# Patient Record
Sex: Female | Born: 1950 | Race: White | Hispanic: No | Marital: Married | Smoking: Never smoker
Health system: Southern US, Community
[De-identification: ages and names within clinical notes are randomized; demographics above are authoritative.]

## PROBLEM LIST (undated history)

## (undated) DIAGNOSIS — L719 Rosacea, unspecified: Secondary | ICD-10-CM

## (undated) DIAGNOSIS — M1990 Unspecified osteoarthritis, unspecified site: Secondary | ICD-10-CM

## (undated) DIAGNOSIS — Z85528 Personal history of other malignant neoplasm of kidney: Secondary | ICD-10-CM

## (undated) DIAGNOSIS — N6019 Diffuse cystic mastopathy of unspecified breast: Secondary | ICD-10-CM

## (undated) DIAGNOSIS — F4310 Post-traumatic stress disorder, unspecified: Secondary | ICD-10-CM

## (undated) DIAGNOSIS — G43909 Migraine, unspecified, not intractable, without status migrainosus: Secondary | ICD-10-CM

## (undated) DIAGNOSIS — K219 Gastro-esophageal reflux disease without esophagitis: Secondary | ICD-10-CM

## (undated) DIAGNOSIS — E041 Nontoxic single thyroid nodule: Secondary | ICD-10-CM

## (undated) DIAGNOSIS — E049 Nontoxic goiter, unspecified: Secondary | ICD-10-CM

## (undated) HISTORY — PX: TUBAL LIGATION: SHX77

## (undated) HISTORY — DX: Nontoxic single thyroid nodule: E04.1

## (undated) HISTORY — DX: Personal history of other malignant neoplasm of kidney: Z85.528

## (undated) HISTORY — PX: OTHER SURGICAL HISTORY: SHX169

## (undated) HISTORY — DX: Migraine, unspecified, not intractable, without status migrainosus: G43.909

## (undated) HISTORY — DX: Diffuse cystic mastopathy of unspecified breast: N60.19

## (undated) HISTORY — DX: Rosacea, unspecified: L71.9

---

## 1999-09-28 DIAGNOSIS — E041 Nontoxic single thyroid nodule: Secondary | ICD-10-CM

## 1999-09-28 HISTORY — DX: Nontoxic single thyroid nodule: E04.1

## 2000-03-04 ENCOUNTER — Ambulatory Visit (HOSPITAL_COMMUNITY): Admission: RE | Admit: 2000-03-04 | Discharge: 2000-03-04 | Payer: Self-pay | Admitting: Urology

## 2000-03-04 ENCOUNTER — Encounter: Payer: Self-pay | Admitting: Urology

## 2000-03-11 ENCOUNTER — Encounter: Payer: Self-pay | Admitting: Family Medicine

## 2000-03-11 ENCOUNTER — Ambulatory Visit (HOSPITAL_COMMUNITY): Admission: RE | Admit: 2000-03-11 | Discharge: 2000-03-11 | Payer: Self-pay | Admitting: Family Medicine

## 2005-02-12 ENCOUNTER — Other Ambulatory Visit: Admission: RE | Admit: 2005-02-12 | Discharge: 2005-02-12 | Payer: Self-pay | Admitting: Family Medicine

## 2006-03-11 ENCOUNTER — Other Ambulatory Visit: Admission: RE | Admit: 2006-03-11 | Discharge: 2006-03-11 | Payer: Self-pay | Admitting: Family Medicine

## 2007-05-12 ENCOUNTER — Other Ambulatory Visit: Admission: RE | Admit: 2007-05-12 | Discharge: 2007-05-12 | Payer: Self-pay | Admitting: Family Medicine

## 2008-06-07 ENCOUNTER — Other Ambulatory Visit: Admission: RE | Admit: 2008-06-07 | Discharge: 2008-06-07 | Payer: Self-pay | Admitting: Family Medicine

## 2009-06-13 ENCOUNTER — Other Ambulatory Visit: Admission: RE | Admit: 2009-06-13 | Discharge: 2009-06-13 | Payer: Self-pay | Admitting: Family Medicine

## 2010-06-19 ENCOUNTER — Other Ambulatory Visit: Admission: RE | Admit: 2010-06-19 | Discharge: 2010-06-19 | Payer: Self-pay | Admitting: Family Medicine

## 2011-03-30 ENCOUNTER — Other Ambulatory Visit: Payer: Self-pay | Admitting: Dermatology

## 2011-07-08 ENCOUNTER — Other Ambulatory Visit: Payer: Self-pay | Admitting: Family Medicine

## 2011-07-08 DIAGNOSIS — E041 Nontoxic single thyroid nodule: Secondary | ICD-10-CM

## 2011-07-16 ENCOUNTER — Ambulatory Visit
Admission: RE | Admit: 2011-07-16 | Discharge: 2011-07-16 | Disposition: A | Discharge status: Home / Self Care | Payer: 59 | Source: Ambulatory Visit | Attending: Family Medicine | Admitting: Family Medicine

## 2011-07-16 DIAGNOSIS — E041 Nontoxic single thyroid nodule: Secondary | ICD-10-CM

## 2012-09-29 ENCOUNTER — Other Ambulatory Visit: Payer: Self-pay

## 2013-07-27 ENCOUNTER — Other Ambulatory Visit (HOSPITAL_COMMUNITY)
Admission: RE | Admit: 2013-07-27 | Discharge: 2013-07-27 | Disposition: A | Discharge status: Home / Self Care | Payer: BC Managed Care – PPO | Source: Ambulatory Visit | Attending: Family Medicine | Admitting: Family Medicine

## 2013-07-27 ENCOUNTER — Other Ambulatory Visit: Payer: Self-pay | Admitting: Family Medicine

## 2013-07-27 DIAGNOSIS — Z124 Encounter for screening for malignant neoplasm of cervix: Secondary | ICD-10-CM | POA: Insufficient documentation

## 2013-10-05 ENCOUNTER — Other Ambulatory Visit: Payer: Self-pay

## 2014-04-02 ENCOUNTER — Other Ambulatory Visit: Payer: Self-pay

## 2014-04-18 ENCOUNTER — Other Ambulatory Visit: Payer: Self-pay

## 2014-08-02 ENCOUNTER — Other Ambulatory Visit: Payer: Self-pay | Admitting: Family Medicine

## 2014-08-02 DIAGNOSIS — E041 Nontoxic single thyroid nodule: Secondary | ICD-10-CM

## 2014-08-29 ENCOUNTER — Ambulatory Visit
Admission: RE | Admit: 2014-08-29 | Discharge: 2014-08-29 | Disposition: A | Discharge status: Home / Self Care | Payer: BC Managed Care – PPO | Source: Ambulatory Visit | Attending: Family Medicine | Admitting: Family Medicine

## 2014-08-29 DIAGNOSIS — E041 Nontoxic single thyroid nodule: Secondary | ICD-10-CM

## 2014-09-27 DIAGNOSIS — Z85528 Personal history of other malignant neoplasm of kidney: Secondary | ICD-10-CM

## 2014-09-27 HISTORY — DX: Personal history of other malignant neoplasm of kidney: Z85.528

## 2015-02-07 ENCOUNTER — Other Ambulatory Visit (HOSPITAL_COMMUNITY): Payer: Self-pay | Admitting: Family Medicine

## 2015-02-07 DIAGNOSIS — R1032 Left lower quadrant pain: Secondary | ICD-10-CM

## 2015-02-10 ENCOUNTER — Encounter (HOSPITAL_COMMUNITY): Payer: Self-pay

## 2015-02-10 ENCOUNTER — Ambulatory Visit (HOSPITAL_COMMUNITY)
Admission: RE | Admit: 2015-02-10 | Discharge: 2015-02-10 | Disposition: A | Discharge status: Home / Self Care | Payer: BLUE CROSS/BLUE SHIELD | Source: Ambulatory Visit | Attending: Family Medicine | Admitting: Family Medicine

## 2015-02-10 DIAGNOSIS — R509 Fever, unspecified: Secondary | ICD-10-CM | POA: Insufficient documentation

## 2015-02-10 DIAGNOSIS — R197 Diarrhea, unspecified: Secondary | ICD-10-CM | POA: Insufficient documentation

## 2015-02-10 DIAGNOSIS — K869 Disease of pancreas, unspecified: Secondary | ICD-10-CM | POA: Insufficient documentation

## 2015-02-10 DIAGNOSIS — R1032 Left lower quadrant pain: Secondary | ICD-10-CM | POA: Diagnosis present

## 2015-02-10 DIAGNOSIS — K5730 Diverticulosis of large intestine without perforation or abscess without bleeding: Secondary | ICD-10-CM | POA: Diagnosis not present

## 2015-02-10 DIAGNOSIS — N2889 Other specified disorders of kidney and ureter: Secondary | ICD-10-CM | POA: Diagnosis not present

## 2015-02-10 LAB — POCT I-STAT CREATININE: CREATININE: 0.8 mg/dL (ref 0.44–1.00)

## 2015-02-10 MED ORDER — IOHEXOL 300 MG/ML  SOLN
80.0000 mL | Freq: Once | INTRAMUSCULAR | Status: AC | PRN
Start: 2015-02-10 — End: 2015-02-10
  Administered 2015-02-10: 80 mL via INTRAVENOUS

## 2015-02-12 ENCOUNTER — Other Ambulatory Visit (HOSPITAL_COMMUNITY): Payer: Self-pay | Admitting: Urology

## 2015-02-12 ENCOUNTER — Ambulatory Visit (HOSPITAL_COMMUNITY)
Admission: RE | Admit: 2015-02-12 | Discharge: 2015-02-12 | Disposition: A | Discharge status: Home / Self Care | Payer: BLUE CROSS/BLUE SHIELD | Source: Ambulatory Visit | Attending: Urology | Admitting: Urology

## 2015-02-12 DIAGNOSIS — N2889 Other specified disorders of kidney and ureter: Secondary | ICD-10-CM | POA: Insufficient documentation

## 2015-03-05 ENCOUNTER — Other Ambulatory Visit: Payer: Self-pay | Admitting: Urology

## 2015-03-06 NOTE — Progress Notes (Signed)
Please put orders in Epic surgery 03-10-15 pre op 03-07-15

## 2015-03-07 ENCOUNTER — Encounter (HOSPITAL_COMMUNITY): Payer: Self-pay

## 2015-03-07 ENCOUNTER — Encounter (HOSPITAL_COMMUNITY)
Admission: RE | Admit: 2015-03-07 | Discharge: 2015-03-07 | Disposition: A | Discharge status: Home / Self Care | Payer: BLUE CROSS/BLUE SHIELD | Source: Ambulatory Visit | Attending: Urology | Admitting: Urology

## 2015-03-07 DIAGNOSIS — N289 Disorder of kidney and ureter, unspecified: Secondary | ICD-10-CM | POA: Insufficient documentation

## 2015-03-07 DIAGNOSIS — Z01818 Encounter for other preprocedural examination: Secondary | ICD-10-CM | POA: Insufficient documentation

## 2015-03-07 HISTORY — DX: Post-traumatic stress disorder, unspecified: F43.10

## 2015-03-07 HISTORY — DX: Nontoxic goiter, unspecified: E04.9

## 2015-03-07 HISTORY — DX: Gastro-esophageal reflux disease without esophagitis: K21.9

## 2015-03-07 HISTORY — DX: Unspecified osteoarthritis, unspecified site: M19.90

## 2015-03-07 LAB — CBC
HCT: 43.3 % (ref 36.0–46.0)
Hemoglobin: 14 g/dL (ref 12.0–15.0)
MCH: 28.9 pg (ref 26.0–34.0)
MCHC: 32.3 g/dL (ref 30.0–36.0)
MCV: 89.3 fL (ref 78.0–100.0)
PLATELETS: 273 10*3/uL (ref 150–400)
RBC: 4.85 MIL/uL (ref 3.87–5.11)
RDW: 13.1 % (ref 11.5–15.5)
WBC: 4.3 10*3/uL (ref 4.0–10.5)

## 2015-03-07 LAB — BASIC METABOLIC PANEL
ANION GAP: 6 (ref 5–15)
BUN: 13 mg/dL (ref 6–20)
CO2: 29 mmol/L (ref 22–32)
Calcium: 9.4 mg/dL (ref 8.9–10.3)
Chloride: 106 mmol/L (ref 101–111)
Creatinine, Ser: 0.79 mg/dL (ref 0.44–1.00)
GFR calc Af Amer: >60 mL/min (ref >60)
GFR calc non Af Amer: >60 mL/min (ref >60)
GLUCOSE: 96 mg/dL (ref 65–99)
Potassium: 4.7 mmol/L (ref 3.5–5.1)
SODIUM: 141 mmol/L (ref 135–145)

## 2015-03-07 LAB — ABO/RH: ABO/RH(D): A POS

## 2015-03-07 NOTE — Patient Instructions (Addendum)
Claire Humphrey  03/07/2015   Your procedure is scheduled on: 03/10/2015    Report to [facility] Main  Entrance and follow signs to               [place] at      0930 AM.  Call this number if you have problems the morning of surgery [phone]   Remember: ONLY 1 PERSON MAY GO WITH YOU TO SHORT STAY TO GET  READY MORNING OF [place].  Do not eat food or drink liquids :After Midnight.     Take these medicines the morning of surgery with A SIP OF WATER: none                                You may not have any metal on your body including hair pins and              piercings  Do not wear jewelry, make-up, lotions, powders or perfumes, deodorant             Do not wear nail polish.  Do not shave  48 hours prior to surgery.           Do not bring valuables to the hospital. [place].  Contacts, dentures or bridgework may not be worn into surgery.  Leave suitcase in the car. After surgery it may be brought to your room.       Special Instructions: coughing and deep breathing exercises, leg exercises               Please read over the following fact sheets you were given: _____________________________________________________________________             [facility] - Preparing for Surgery Before surgery, you can play an important role.  Because skin is not sterile, your skin needs to be as free of germs as possible.  You can reduce the number of germs on your skin by washing with CHG (chlorahexidine gluconate) soap before surgery.  CHG is an antiseptic cleaner which kills germs and bonds with the skin to continue killing germs even after washing. Please DO NOT use if you have an allergy to CHG or antibacterial soaps.  If your skin becomes reddened/irritated stop using the CHG and inform your nurse when you arrive at Short Stay. Do not shave (including legs and underarms) for at least 48 hours prior to  the first CHG shower.  You may shave your face/neck. Please follow these instructions carefully:  1.  Shower with CHG Soap the night before surgery and the  morning of Surgery.  2.  If you choose to wash your hair, wash your hair first as usual with your  normal  shampoo.  3.  After you shampoo, rinse your hair and body thoroughly to remove the  shampoo.                           4.  Use CHG as you would any other liquid soap.  You can apply chg directly  to the skin and wash  Gently with a scrungie or clean washcloth.  5.  Apply the CHG Soap to your body ONLY FROM THE NECK DOWN.   Do not use on face/ open                           Wound or open sores. Avoid contact with eyes, ears mouth and genitals (private parts).                       Wash face,  Genitals (private parts) with your normal soap.             6.  Wash thoroughly, paying special attention to the area where your surgery  will be performed.  7.  Thoroughly rinse your body with warm water from the neck down.  8.  DO NOT shower/wash with your normal soap after using and rinsing off  the CHG Soap.                9.  Pat yourself dry with a clean towel.            10.  Wear clean pajamas.            11.  Place clean sheets on your bed the night of your first shower and do not  sleep with pets. Day of Surgery : Do not apply any lotions/deodorants the morning of surgery.  Please wear clean clothes to the hospital/surgery center.  FAILURE TO FOLLOW THESE INSTRUCTIONS MAY RESULT IN THE CANCELLATION OF YOUR SURGERY PATIENT SIGNATURE_________________________________  NURSE SIGNATURE__________________________________  ________________________________________________________________________  WHAT IS A BLOOD TRANSFUSION? Blood Transfusion Information  A transfusion is the replacement of blood or some of its parts. Blood is made up of multiple cells which provide different functions.  Red blood cells carry oxygen and  are used for blood loss replacement.  White blood cells fight against infection.  Platelets control bleeding.  Plasma helps clot blood.  Other blood products are available for specialized needs, such as hemophilia or other clotting disorders. BEFORE THE TRANSFUSION  Who gives blood for transfusions?   Healthy volunteers who are fully evaluated to make sure their blood is safe. This is blood bank blood. Transfusion therapy is the safest it has ever been in the practice of medicine. Before blood is taken from a donor, a complete history is taken to make sure that person has no history of diseases nor engages in risky social behavior (examples are intravenous drug use or sexual activity with multiple partners). The donor's travel history is screened to minimize risk of transmitting infections, such as malaria. The donated blood is tested for signs of infectious diseases, such as HIV and hepatitis. The blood is then tested to be sure it is compatible with you in order to minimize the chance of a transfusion reaction. If you or a relative donates blood, this is often done in anticipation of surgery and is not appropriate for emergency situations. It takes many days to process the donated blood. RISKS AND COMPLICATIONS Although transfusion therapy is very safe and saves many lives, the main dangers of transfusion include:   Getting an infectious disease.  Developing a transfusion reaction. This is an allergic reaction to something in the blood you were given. Every precaution is taken to prevent this. The decision to have a blood transfusion has been considered carefully by your caregiver before blood is given. Blood is not given unless the benefits outweigh  the risks. AFTER THE TRANSFUSION  Right after receiving a blood transfusion, you will usually feel much better and more energetic. This is especially true if your red blood cells have gotten low (anemic). The transfusion raises the level of the  red blood cells which carry oxygen, and this usually causes an energy increase.  The nurse administering the transfusion will monitor you carefully for complications. HOME CARE INSTRUCTIONS  No special instructions are needed after a transfusion. You may find your energy is better. Speak with your caregiver about any limitations on activity for underlying diseases you may have. SEEK MEDICAL CARE IF:   Your condition is not improving after your transfusion.  You develop redness or irritation at the intravenous (IV) site. SEEK IMMEDIATE MEDICAL CARE IF:  Any of the following symptoms occur over the next 12 hours:  Shaking chills.  You have a temperature by mouth above 102 F (38.9 C), not controlled by medicine.  Chest, back, or muscle pain.  People around you feel you are not acting correctly or are confused.  Shortness of breath or difficulty breathing.  Dizziness and fainting.  You get a rash or develop hives.  You have a decrease in urine output.  Your urine turns a dark color or changes to pink, red, or brown. Any of the following symptoms occur over the next 10 days:  You have a temperature by mouth above 102 F (38.9 C), not controlled by medicine.  Shortness of breath.  Weakness after normal activity.  The white part of the eye turns yellow (jaundice).  You have a decrease in the amount of urine or are urinating less often.  Your urine turns a dark color or changes to pink, red, or brown. Document Released: 09/10/2000 Document Revised: 12/06/2011 Document Reviewed: 04/29/2008 [facility] Patient Information 2014 [place], [place].  _______________________________________________________________________

## 2015-03-07 NOTE — Progress Notes (Signed)
Called and left message with Noemi Chapel regarding no orders in EPIC.

## 2015-03-07 NOTE — Progress Notes (Signed)
Patient aware  To hold aspirin 5 days prior to surgery per fax received from [place]   Patient states she does not take daily anyway and last aspirin was on 03/05/2015.

## 2015-03-07 NOTE — Progress Notes (Signed)
Surgery on 03/10/2015.  Preop on 6/10 at 1030am .  No orders in EPIC.  Need orders in EPIC.  Thank You.

## 2015-03-07 NOTE — Progress Notes (Signed)
Patient states lung CT was done at office of Dr Nicolette Bang to followup CXR done 02/12/15 - EPIC.  Called and left message for Noemi Chapel and asked for lung CT scan to be faxed to [phone].

## 2015-03-07 NOTE — Progress Notes (Addendum)
Called and asked [facility] to [facility] LOV note on patient since letter of clearance states - progress note attached but is not.  Also informed them that fax cover sheet does not have patient's name on the cover sheet.  They will give me a call back.  nicole called back and stated all info was faxed to [facility] including progress note.  I asked her about the fax cover sheet " Patient advised to hold aspirin 5 days prior" and Elmyra Ricks ( nurse of Dr Kelton Pillar) stated that was the fax cover sheet for this patient and patient was instructed to hold aspirin 5 days prior.

## 2015-03-07 NOTE — Progress Notes (Signed)
CXR- 5/18/6- EPIC  Clearance- 02/20/15- Dr Kelton Pillar on chart  CBC and CMP done 02/12/2015 on chart.

## 2015-03-10 ENCOUNTER — Inpatient Hospital Stay (HOSPITAL_COMMUNITY): Payer: BLUE CROSS/BLUE SHIELD | Admitting: Certified Registered Nurse Anesthetist

## 2015-03-10 ENCOUNTER — Inpatient Hospital Stay (HOSPITAL_COMMUNITY)
Admission: RE | Admit: 2015-03-10 | Discharge: 2015-03-13 | DRG: 658 | Disposition: A | Discharge status: Home / Self Care | Payer: BLUE CROSS/BLUE SHIELD | Source: Ambulatory Visit | Attending: Urology | Admitting: Urology

## 2015-03-10 ENCOUNTER — Encounter (HOSPITAL_COMMUNITY)
Admission: RE | Disposition: A | Discharge status: Home / Self Care | Payer: Self-pay | Source: Ambulatory Visit | Attending: Urology

## 2015-03-10 ENCOUNTER — Encounter (HOSPITAL_COMMUNITY): Payer: Self-pay | Admitting: *Deleted

## 2015-03-10 DIAGNOSIS — Z01812 Encounter for preprocedural laboratory examination: Secondary | ICD-10-CM | POA: Diagnosis not present

## 2015-03-10 DIAGNOSIS — C642 Malignant neoplasm of left kidney, except renal pelvis: Secondary | ICD-10-CM | POA: Diagnosis present

## 2015-03-10 DIAGNOSIS — N2889 Other specified disorders of kidney and ureter: Secondary | ICD-10-CM | POA: Diagnosis present

## 2015-03-10 DIAGNOSIS — K219 Gastro-esophageal reflux disease without esophagitis: Secondary | ICD-10-CM | POA: Diagnosis present

## 2015-03-10 HISTORY — PX: LAPAROSCOPIC NEPHRECTOMY: SHX1930

## 2015-03-10 LAB — CBC
HCT: 41.8 % (ref 36.0–46.0)
HCT: 41.3 % (ref 36.0–46.0)
HEMOGLOBIN: 13.4 g/dL (ref 12.0–15.0)
Hemoglobin: 13.3 g/dL (ref 12.0–15.0)
MCH: 28.9 pg (ref 26.0–34.0)
MCH: 29 pg (ref 26.0–34.0)
MCHC: 32.1 g/dL (ref 30.0–36.0)
MCHC: 32.2 g/dL (ref 30.0–36.0)
MCV: 90.1 fL (ref 78.0–100.0)
MCV: 90.2 fL (ref 78.0–100.0)
Platelets: 241 10*3/uL (ref 150–400)
Platelets: 221 10*3/uL (ref 150–400)
RBC: 4.64 MIL/uL (ref 3.87–5.11)
RBC: 4.58 MIL/uL (ref 3.87–5.11)
RDW: 13.3 % (ref 11.5–15.5)
RDW: 13.3 % (ref 11.5–15.5)
WBC: 10.5 10*3/uL (ref 4.0–10.5)
WBC: 10.8 10*3/uL — AB (ref 4.0–10.5)

## 2015-03-10 LAB — TYPE AND SCREEN
ABO/RH(D): A POS
Antibody Screen: NEGATIVE

## 2015-03-10 LAB — CREATININE, SERUM
Creatinine, Ser: 1 mg/dL (ref 0.44–1.00)
GFR, EST NON AFRICAN AMERICAN: 58 mL/min — AB (ref >60)

## 2015-03-10 SURGERY — NEPHRECTOMY, RADICAL, LAPAROSCOPIC, ADULT
Anesthesia: General | Site: Abdomen | Laterality: Left

## 2015-03-10 MED ORDER — LACTATED RINGERS IV SOLN
INTRAVENOUS | Status: DC
  Administered 2015-03-10: 1000 mL via INTRAVENOUS
  Administered 2015-03-10: 13:00:00 via INTRAVENOUS

## 2015-03-10 MED ORDER — MIDAZOLAM HCL 2 MG/2ML IJ SOLN
INTRAMUSCULAR | Status: AC
  Filled 2015-03-10: qty 2

## 2015-03-10 MED ORDER — OXYCODONE-ACETAMINOPHEN 5-325 MG PO TABS
1.0000 | ORAL_TABLET | ORAL | Status: DC | PRN
  Administered 2015-03-11 (×2): 2 via ORAL
  Administered 2015-03-11 (×2): 1 via ORAL
  Administered 2015-03-12 – 2015-03-13 (×8): 2 via ORAL
  Filled 2015-03-10: qty 2
  Filled 2015-03-10: qty 1
  Filled 2015-03-10 (×4): qty 2
  Filled 2015-03-10: qty 1
  Filled 2015-03-10 (×5): qty 2

## 2015-03-10 MED ORDER — ONDANSETRON HCL 4 MG/2ML IJ SOLN
INTRAMUSCULAR | Status: DC | PRN
  Administered 2015-03-10: 4 mg via INTRAVENOUS

## 2015-03-10 MED ORDER — HEPARIN SODIUM (PORCINE) 5000 UNIT/ML IJ SOLN
5000.0000 [IU] | Freq: Once | INTRAMUSCULAR | Status: AC
  Administered 2015-03-10: 5000 [IU] via SUBCUTANEOUS
  Filled 2015-03-10: qty 1

## 2015-03-10 MED ORDER — SUCCINYLCHOLINE CHLORIDE 20 MG/ML IJ SOLN
INTRAMUSCULAR | Status: DC | PRN
  Administered 2015-03-10: 100 mg via INTRAVENOUS

## 2015-03-10 MED ORDER — CHLORHEXIDINE GLUCONATE 0.12 % MT SOLN
15.0000 mL | Freq: Two times a day (BID) | OROMUCOSAL | Status: DC
  Administered 2015-03-10 – 2015-03-13 (×5): 15 mL via OROMUCOSAL
  Filled 2015-03-10 (×6): qty 15

## 2015-03-10 MED ORDER — ROCURONIUM BROMIDE 100 MG/10ML IV SOLN
INTRAVENOUS | Status: AC
  Filled 2015-03-10: qty 1

## 2015-03-10 MED ORDER — BELLADONNA ALKALOIDS-OPIUM 16.2-60 MG RE SUPP: 1.0000 | Freq: Four times a day (QID) | RECTAL | Status: DC | PRN

## 2015-03-10 MED ORDER — GLYCOPYRROLATE 0.2 MG/ML IJ SOLN
INTRAMUSCULAR | Status: AC
  Filled 2015-03-10: qty 1

## 2015-03-10 MED ORDER — POLYETHYLENE GLYCOL 3350 17 G PO PACK: 17.0000 g | PACK | Freq: Every day | ORAL | Status: DC | PRN

## 2015-03-10 MED ORDER — SODIUM CHLORIDE 0.9 % IJ SOLN
INTRAMUSCULAR | Status: AC
  Filled 2015-03-10: qty 10

## 2015-03-10 MED ORDER — EPHEDRINE SULFATE 50 MG/ML IJ SOLN
INTRAMUSCULAR | Status: DC | PRN
  Administered 2015-03-10 (×2): 10 mg via INTRAVENOUS

## 2015-03-10 MED ORDER — CEFAZOLIN SODIUM 1-5 GM-% IV SOLN
1.0000 g | Freq: Three times a day (TID) | INTRAVENOUS | Status: AC
  Administered 2015-03-10 – 2015-03-11 (×3): 1 g via INTRAVENOUS
  Filled 2015-03-10 (×3): qty 50

## 2015-03-10 MED ORDER — FENTANYL CITRATE (PF) 250 MCG/5ML IJ SOLN
INTRAMUSCULAR | Status: AC
  Filled 2015-03-10: qty 5

## 2015-03-10 MED ORDER — DEXAMETHASONE SODIUM PHOSPHATE 10 MG/ML IJ SOLN
INTRAMUSCULAR | Status: AC
  Filled 2015-03-10: qty 1

## 2015-03-10 MED ORDER — ACETAMINOPHEN 325 MG PO TABS
650.0000 mg | ORAL_TABLET | ORAL | Status: DC | PRN
  Administered 2015-03-11: 650 mg via ORAL
  Filled 2015-03-10 (×2): qty 2

## 2015-03-10 MED ORDER — PROMETHAZINE HCL 25 MG/ML IJ SOLN: 6.2500 mg | INTRAMUSCULAR | Status: DC | PRN

## 2015-03-10 MED ORDER — GLYCOPYRROLATE 0.2 MG/ML IJ SOLN
INTRAMUSCULAR | Status: AC
  Filled 2015-03-10: qty 3

## 2015-03-10 MED ORDER — CEFAZOLIN SODIUM-DEXTROSE 2-3 GM-% IV SOLR
2.0000 g | INTRAVENOUS | Status: AC
  Administered 2015-03-10: 2 g via INTRAVENOUS

## 2015-03-10 MED ORDER — DEXTROSE-NACL 5-0.45 % IV SOLN
INTRAVENOUS | Status: DC
  Administered 2015-03-10 – 2015-03-11 (×4): via INTRAVENOUS

## 2015-03-10 MED ORDER — ROCURONIUM BROMIDE 100 MG/10ML IV SOLN
INTRAVENOUS | Status: DC | PRN
  Administered 2015-03-10: 10 mg via INTRAVENOUS
  Administered 2015-03-10: 40 mg via INTRAVENOUS
  Administered 2015-03-10: 10 mg via INTRAVENOUS

## 2015-03-10 MED ORDER — DIPHENHYDRAMINE HCL 12.5 MG/5ML PO ELIX: 12.5000 mg | ORAL_SOLUTION | Freq: Four times a day (QID) | ORAL | Status: DC | PRN

## 2015-03-10 MED ORDER — PROPOFOL 10 MG/ML IV BOLUS
INTRAVENOUS | Status: AC
  Filled 2015-03-10: qty 20

## 2015-03-10 MED ORDER — HYDROMORPHONE HCL 2 MG/ML IJ SOLN
INTRAMUSCULAR | Status: AC
  Filled 2015-03-10: qty 1

## 2015-03-10 MED ORDER — HYDROMORPHONE HCL 1 MG/ML IJ SOLN
INTRAMUSCULAR | Status: AC
  Filled 2015-03-10: qty 1

## 2015-03-10 MED ORDER — FENTANYL CITRATE (PF) 100 MCG/2ML IJ SOLN
INTRAMUSCULAR | Status: DC | PRN
  Administered 2015-03-10 (×5): 50 ug via INTRAVENOUS

## 2015-03-10 MED ORDER — DOCUSATE SODIUM 100 MG PO CAPS
100.0000 mg | ORAL_CAPSULE | Freq: Two times a day (BID) | ORAL | Status: DC
  Administered 2015-03-10 – 2015-03-13 (×6): 100 mg via ORAL
  Filled 2015-03-10 (×6): qty 1

## 2015-03-10 MED ORDER — ONDANSETRON HCL 4 MG/2ML IJ SOLN
4.0000 mg | INTRAMUSCULAR | Status: DC | PRN
Start: 2015-03-10 — End: 2015-03-13
  Administered 2015-03-11 – 2015-03-12 (×5): 4 mg via INTRAVENOUS
  Filled 2015-03-10 (×6): qty 2

## 2015-03-10 MED ORDER — MAGNESIUM CITRATE PO SOLN: 1.0000 | Freq: Once | ORAL | Status: AC | PRN

## 2015-03-10 MED ORDER — EPHEDRINE SULFATE 50 MG/ML IJ SOLN
INTRAMUSCULAR | Status: AC
  Filled 2015-03-10: qty 1

## 2015-03-10 MED ORDER — ONDANSETRON HCL 4 MG/2ML IJ SOLN
INTRAMUSCULAR | Status: AC
  Filled 2015-03-10: qty 2

## 2015-03-10 MED ORDER — HYDROMORPHONE HCL 1 MG/ML IJ SOLN
0.2500 mg | INTRAMUSCULAR | Status: DC | PRN
  Administered 2015-03-10: 0.25 mg via INTRAVENOUS
  Administered 2015-03-10 (×2): 0.5 mg via INTRAVENOUS
  Administered 2015-03-10 (×2): 0.25 mg via INTRAVENOUS

## 2015-03-10 MED ORDER — BISACODYL 10 MG RE SUPP
10.0000 mg | Freq: Every day | RECTAL | Status: DC | PRN
  Administered 2015-03-12: 10 mg via RECTAL
  Filled 2015-03-10: qty 1

## 2015-03-10 MED ORDER — DIPHENHYDRAMINE HCL 50 MG/ML IJ SOLN: 12.5000 mg | Freq: Four times a day (QID) | INTRAMUSCULAR | Status: DC | PRN

## 2015-03-10 MED ORDER — CEFAZOLIN SODIUM-DEXTROSE 2-3 GM-% IV SOLR
INTRAVENOUS | Status: AC
  Filled 2015-03-10: qty 50

## 2015-03-10 MED ORDER — DEXAMETHASONE SODIUM PHOSPHATE 10 MG/ML IJ SOLN
INTRAMUSCULAR | Status: DC | PRN
  Administered 2015-03-10: 10 mg via INTRAVENOUS

## 2015-03-10 MED ORDER — CETYLPYRIDINIUM CHLORIDE 0.05 % MT LIQD
7.0000 mL | Freq: Two times a day (BID) | OROMUCOSAL | Status: DC
  Administered 2015-03-11 – 2015-03-12 (×3): 7 mL via OROMUCOSAL

## 2015-03-10 MED ORDER — GLYCOPYRROLATE 0.2 MG/ML IJ SOLN
INTRAMUSCULAR | Status: DC | PRN
  Administered 2015-03-10: 0.6 mg via INTRAVENOUS
  Administered 2015-03-10: 0.2 mg via INTRAVENOUS

## 2015-03-10 MED ORDER — NEOSTIGMINE METHYLSULFATE 10 MG/10ML IV SOLN
INTRAVENOUS | Status: DC | PRN
  Administered 2015-03-10: 5 mg via INTRAVENOUS

## 2015-03-10 MED ORDER — PROPOFOL 10 MG/ML IV BOLUS
INTRAVENOUS | Status: DC | PRN
  Administered 2015-03-10: 130 mg via INTRAVENOUS

## 2015-03-10 MED ORDER — OXYBUTYNIN CHLORIDE 5 MG PO TABS: 5.0000 mg | ORAL_TABLET | Freq: Three times a day (TID) | ORAL | Status: DC | PRN

## 2015-03-10 MED ORDER — MIDAZOLAM HCL 5 MG/5ML IJ SOLN
INTRAMUSCULAR | Status: DC | PRN
  Administered 2015-03-10: 2 mg via INTRAVENOUS

## 2015-03-10 MED ORDER — LIDOCAINE HCL (CARDIAC) 20 MG/ML IV SOLN
INTRAVENOUS | Status: DC | PRN
  Administered 2015-03-10: 100 mg via INTRAVENOUS

## 2015-03-10 MED ORDER — HEPARIN SODIUM (PORCINE) 5000 UNIT/ML IJ SOLN
5000.0000 [IU] | Freq: Three times a day (TID) | INTRAMUSCULAR | Status: DC
  Administered 2015-03-11 – 2015-03-13 (×7): 5000 [IU] via SUBCUTANEOUS
  Filled 2015-03-10 (×8): qty 1

## 2015-03-10 MED ORDER — HYDROMORPHONE HCL 1 MG/ML IJ SOLN
0.5000 mg | INTRAMUSCULAR | Status: DC | PRN
  Administered 2015-03-10 – 2015-03-11 (×6): 1 mg via INTRAVENOUS
  Administered 2015-03-11: 0.5 mg via INTRAVENOUS
  Administered 2015-03-11 (×2): 1 mg via INTRAVENOUS
  Filled 2015-03-10 (×9): qty 1

## 2015-03-10 MED ORDER — HYDROMORPHONE HCL 1 MG/ML IJ SOLN
INTRAMUSCULAR | Status: DC | PRN
  Administered 2015-03-10: 0.5 mg via INTRAVENOUS
  Administered 2015-03-10: 1 mg via INTRAVENOUS
  Administered 2015-03-10: 0.5 mg via INTRAVENOUS

## 2015-03-10 MED ORDER — LIDOCAINE HCL 1 % IJ SOLN
INTRAMUSCULAR | Status: DC | PRN
  Administered 2015-03-10: 10 mL

## 2015-03-10 MED ORDER — LIDOCAINE HCL 1 % IJ SOLN
INTRAMUSCULAR | Status: AC
  Filled 2015-03-10: qty 20

## 2015-03-10 MED ORDER — LIDOCAINE HCL (CARDIAC) 20 MG/ML IV SOLN
INTRAVENOUS | Status: AC
  Filled 2015-03-10: qty 5

## 2015-03-10 SURGICAL SUPPLY — 62 items
BAG ZIPLOCK 12X15 (MISCELLANEOUS) IMPLANT
BLADE EXTENDED COATED 6.5IN (ELECTRODE) IMPLANT
BLADE HEX COATED 2.75 (ELECTRODE) ×3 IMPLANT
BLADE SURG SZ10 CARB STEEL (BLADE) ×3 IMPLANT
CATH FOLEY LATEX FREE 16FR (CATHETERS) ×3 IMPLANT
CHLORAPREP W/TINT 26ML (MISCELLANEOUS) ×3 IMPLANT
CLIP LIGATING HEM O LOK PURPLE (MISCELLANEOUS) ×3 IMPLANT
CLIP LIGATING HEMO LOK XL GOLD (MISCELLANEOUS) ×3 IMPLANT
CLIP LIGATING HEMO O LOK GREEN (MISCELLANEOUS) ×3 IMPLANT
COVER SURGICAL LIGHT HANDLE (MISCELLANEOUS) IMPLANT
DISSECT BALLN SPACEMKR + OVL (BALLOONS)
DISSECTOR BALLN SPACEMKR + OVL (BALLOONS) IMPLANT
DRAIN CHANNEL 10F 3/8 F FF (DRAIN) IMPLANT
DRAPE INCISE IOBAN 66X45 STRL (DRAPES) ×3 IMPLANT
DRAPE LAPAROSCOPIC ABDOMINAL (DRAPES) ×3 IMPLANT
DRAPE WARM FLUID 44X44 (DRAPE) ×3 IMPLANT
DRSG TEGADERM 4X4.75 (GAUZE/BANDAGES/DRESSINGS) IMPLANT
ELECT REM PT RETURN 9FT ADLT (ELECTROSURGICAL) ×3
ELECTRODE REM PT RTRN 9FT ADLT (ELECTROSURGICAL) ×1 IMPLANT
EVACUATOR SILICONE 100CC (DRAIN) IMPLANT
FLOSEAL 10ML (HEMOSTASIS) IMPLANT
GAUZE SPONGE 4X4 12PLY STRL (GAUZE/BANDAGES/DRESSINGS) IMPLANT
GLOVE BIOGEL M STRL SZ7.5 (GLOVE) ×3 IMPLANT
GOWN STRL REUS W/TWL LRG LVL3 (GOWN DISPOSABLE) ×9 IMPLANT
HEMOSTAT SURGICEL 4X8 (HEMOSTASIS) ×3 IMPLANT
KIT BASIN OR (CUSTOM PROCEDURE TRAY) ×3 IMPLANT
LIQUID BAND (GAUZE/BANDAGES/DRESSINGS) ×3 IMPLANT
PENCIL BUTTON HOLSTER BLD 10FT (ELECTRODE) ×3 IMPLANT
POSITIONER SURGICAL ARM (MISCELLANEOUS) IMPLANT
POUCH ENDO CATCH II 15MM (MISCELLANEOUS) IMPLANT
RELOAD STAPLER BLUE 60MM (STAPLE) ×1 IMPLANT
RELOAD STAPLER WHITE 60MM (STAPLE) ×2 IMPLANT
RETRACTOR LAPSCP 12X46 CVD (ENDOMECHANICALS) IMPLANT
RTRCTR LAPSCP 12X46 CVD (ENDOMECHANICALS)
SCISSORS LAP 5X35 DISP (ENDOMECHANICALS) IMPLANT
SET IRRIG TUBING LAPAROSCOPIC (IRRIGATION / IRRIGATOR) ×3 IMPLANT
SHEARS HARMONIC ACE PLUS 36CM (ENDOMECHANICALS) ×3 IMPLANT
SOLUTION ANTI FOG 6CC (MISCELLANEOUS) ×3 IMPLANT
SPONGE LAP 18X18 X RAY DECT (DISPOSABLE) ×3 IMPLANT
SPONGE LAP 4X18 X RAY DECT (DISPOSABLE) ×3 IMPLANT
SPONGE SURGIFOAM ABS GEL 100 (HEMOSTASIS) IMPLANT
STAPLE ECHEON FLEX 60 POW ENDO (STAPLE) ×3 IMPLANT
STAPLER RELOAD BLUE 60MM (STAPLE) ×3
STAPLER RELOAD WHITE 60MM (STAPLE) ×6
SUT ETHILON 3 0 PS 1 (SUTURE) IMPLANT
SUT MNCRL AB 4-0 PS2 18 (SUTURE) ×3 IMPLANT
SUT PDS AB 1 CTX 36 (SUTURE) IMPLANT
SUT VIC AB 2-0 SH 27 (SUTURE)
SUT VIC AB 2-0 SH 27X BRD (SUTURE) IMPLANT
SUT VICRYL 0 UR6 27IN ABS (SUTURE) IMPLANT
TAPE STRIPS DRAPE STRL (GAUZE/BANDAGES/DRESSINGS) IMPLANT
TOWEL OR 17X26 10 PK STRL BLUE (TOWEL DISPOSABLE) ×3 IMPLANT
TOWEL OR NON WOVEN STRL DISP B (DISPOSABLE) ×3 IMPLANT
TRAY FOLEY W/METER SILVER 14FR (SET/KITS/TRAYS/PACK) IMPLANT
TRAY LAPAROSCOPIC (CUSTOM PROCEDURE TRAY) ×3 IMPLANT
TROCAR ADV FIXATION 12X100MM (TROCAR) ×6 IMPLANT
TROCAR BALLN 12MMX100 BLUNT (TROCAR) ×3 IMPLANT
TROCAR BLADELESS OPT 5 100 (ENDOMECHANICALS) ×3 IMPLANT
TROCAR BLADELESS OPT 5 75 (ENDOMECHANICALS) IMPLANT
TROCAR XCEL 12X100 BLDLESS (ENDOMECHANICALS) IMPLANT
TUBING INSUFFLATION 10FT LAP (TUBING) ×3 IMPLANT
YANKAUER SUCT BULB TIP 10FT TU (MISCELLANEOUS) IMPLANT

## 2015-03-10 NOTE — Anesthesia Procedure Notes (Signed)
Procedure Name: Intubation Date/Time: 03/10/2015 1:09 PM Performed by: Maxwell Caul Pre-anesthesia Checklist: Patient identified, Emergency Drugs available, Suction available and Patient being monitored Patient Re-evaluated:Patient Re-evaluated prior to inductionOxygen Delivery Method: Circle System Utilized Preoxygenation: Pre-oxygenation with 100% oxygen Intubation Type: IV induction Ventilation: Mask ventilation without difficulty Laryngoscope Size: Mac and 4 Grade View: Grade I Tube type: Oral Tube size: 7.0 mm Number of attempts: 1 Airway Equipment and Method: Stylet and Oral airway Placement Confirmation: ETT inserted through vocal cords under direct vision,  positive ETCO2 and breath sounds checked- equal and bilateral Secured at: 21 cm Tube secured with: Tape Dental Injury: Teeth and Oropharynx as per pre-operative assessment

## 2015-03-10 NOTE — Plan of Care (Signed)
Problem: Phase I Progression Outcomes Goal: Tolerating diet Outcome: Not Met (add Reason) npo

## 2015-03-10 NOTE — Anesthesia Postprocedure Evaluation (Signed)
  Anesthesia Post-op Note  Patient: Claire Humphrey  Procedure(s) Performed: Procedure(s): LAPAROSCOPIC LEFT NEPHRECTOMY (Left)  Patient Location: PACU  Anesthesia Type:General  Level of Consciousness: awake  Airway and Oxygen Therapy: Patient Spontanous Breathing  Post-op Pain: mild  Post-op Assessment: Post-op Vital signs reviewed              Post-op Vital Signs: Reviewed  Last Vitals:  Filed Vitals:   03/10/15 0950  BP: 149/88  Pulse: 85  Temp: 36.8 C  Resp: 16    Complications: No apparent anesthesia complications

## 2015-03-10 NOTE — Anesthesia Postprocedure Evaluation (Signed)
  Anesthesia Post-op Note  Patient: Claire Humphrey  Procedure(s) Performed: Procedure(s): LAPAROSCOPIC LEFT NEPHRECTOMY (Left)  Patient Location: PACU  Anesthesia Type:General  Level of Consciousness: awake  Airway and Oxygen Therapy: Patient Spontanous Breathing  Post-op Pain: mild  Post-op Assessment: Post-op Vital signs reviewed              Post-op Vital Signs: Reviewed  Last Vitals:  Filed Vitals:   03/10/15 1711  BP: 146/61  Pulse: 55  Temp: 36.3 C  Resp: 16    Complications: No apparent anesthesia complications

## 2015-03-10 NOTE — H&P (Signed)
Urology Admission H&P  Chief Complaint: Left abdominal pain  History of Present Illness: Claire Humphrey is a yo here with a large left renal mass. It was discovered on workup for left abdominal pain. Currently she does not have any pain. She denies LUTS. She denies hematuria.  Past Medical History  Diagnosis Date  . Goiter     multinodular thyroid   . GERD (gastroesophageal reflux disease)     hx of   . Arthritis     hands   . PTSD (post-traumatic stress disorder)     hx of    Past Surgical History  Procedure Laterality Date  . Left index finger titanium screw surgery     . Bilateral carpal tunnel surgery     . Right middle finger cyst removal     . Tubal ligation    . Laparoscopic surgery       due to ovarian cysts- 1990     Home Medications:  Prescriptions prior to admission  Medication Sig Dispense Refill Last Dose  . aspirin 81 MG tablet Take 81 mg by mouth daily.   03/06/2015  . Calcium Carb-Cholecalciferol (CALCIUM 600 + D PO) Take 1 tablet by mouth 2 (two) times daily.   03/01/2015  . psyllium (METAMUCIL) 58.6 % powder Take 1 packet by mouth 2 (two) times daily.   03/08/2015   Allergies:  Allergies  Allergen Reactions  . Adhesive [Tape] Other (See Comments)    redness  . Latex Rash  . Sulfur Rash    History reviewed. No pertinent family history. Social History:  reports that she has never smoked. She has never used smokeless tobacco. She reports that she drinks alcohol. She reports that she does not use illicit drugs.  Review of Systems  All other systems reviewed and are negative.   Physical Exam:  Vital signs in last 24 hours: Temp:  [98.2 F (36.8 C)] 98.2 F (36.8 C) (06/13 0950) Pulse Rate:  [85] 85 (06/13 0950) Resp:  [16] 16 (06/13 0950) BP: (149)/(88) 149/88 mmHg (06/13 0950) SpO2:  [100 %] 100 % (06/13 0950) Weight:  [63.05 kg (139 lb)] 63.05 kg (139 lb) (06/13 0950) Physical Exam  Constitutional: She is oriented to person, place, and time. She  appears well-developed and well-nourished.  HENT:  Head: Normocephalic and atraumatic.  Right Ear: External ear normal.  Left Ear: External ear normal.  Eyes: EOM are normal. Pupils are equal, round, and reactive to light.  Neck: Normal range of motion.  Cardiovascular: Normal rate, regular rhythm and intact distal pulses.   Respiratory: Effort normal and breath sounds normal. No respiratory distress. She has no wheezes. She has no rales. She exhibits no tenderness.  GI: Soft. She exhibits mass. She exhibits no distension. There is no tenderness. There is no rebound and no guarding.  Musculoskeletal: Normal range of motion.  Neurological: She is alert and oriented to person, place, and time.  Skin: Skin is warm and dry. No rash noted. No erythema. No pallor.  Psychiatric: She has a normal mood and affect. Her behavior is normal. Judgment and thought content normal.    Laboratory Data:  No results found for this or any previous visit (from the past 24 hour(s)). No results found for this or any previous visit (from the past 240 hour(s)). Creatinine:  Recent Labs  03/07/15 1105  CREATININE 0.79   Baseline Creatinine: 0.8  Impression/Assessment:  Left renal mass  Plan:  Risks/benefits/alternatives to left lap radical nephrectomy explained to  the patient and she understands and wishes to proceed with surgery.  Frona Yost L 03/10/2015, 11:38 AM

## 2015-03-10 NOTE — Anesthesia Preprocedure Evaluation (Addendum)
Anesthesia Evaluation  Patient identified by MRN, date of birth, ID band Patient awake    Reviewed: Allergy & Precautions, NPO status   History of Anesthesia Complications (+) PONV and DIFFICULT AIRWAY  Airway Mallampati: II  TM Distance: >3 FB Neck ROM: Full    Dental   Pulmonary  breath sounds clear to auscultation        Cardiovascular negative cardio ROS  Rhythm:Regular Rate:Normal     Neuro/Psych    GI/Hepatic Neg liver ROS, GERD-  ,  Endo/Other  negative endocrine ROS  Renal/GU negative Renal ROS     Musculoskeletal   Abdominal   Peds  Hematology   Anesthesia Other Findings   Reproductive/Obstetrics                            Anesthesia Physical Anesthesia Plan  ASA: III  Anesthesia Plan: General   Post-op Pain Management:    Induction: Intravenous  Airway Management Planned: Oral ETT  Additional Equipment:   Intra-op Plan:   Post-operative Plan: Possible Post-op intubation/ventilation  Informed Consent: I have reviewed the patients History and Physical, chart, labs and discussed the procedure including the risks, benefits and alternatives for the proposed anesthesia with the patient or authorized representative who has indicated his/her understanding and acceptance.   Dental advisory given  Plan Discussed with: CRNA, Anesthesiologist and Surgeon  Anesthesia Plan Comments:        Anesthesia Quick Evaluation

## 2015-03-10 NOTE — Brief Op Note (Signed)
03/10/2015  3:47 PM  PATIENT:  Claire Humphrey  64 y.o. female  PRE-OPERATIVE DIAGNOSIS:  LEFT RENAL MASS  POST-OPERATIVE DIAGNOSIS:  left renal mass  PROCEDURE:  Procedure(s): LAPAROSCOPIC LEFT NEPHRECTOMY (Left)  SURGEON:  Surgeon(s) and Role:    * Cleon Gustin, MD - Primary  PHYSICIAN ASSISTANT:   ASSISTANTS: none   ANESTHESIA:   general  EBL:  Total I/O In: 1000 [I.V.:1000] Out: 50 [Blood:50]  BLOOD ADMINISTERED:none  DRAINS: Urinary Catheter (Foley)   LOCAL MEDICATIONS USED:  LIDOCAINE   SPECIMEN:  Source of Specimen:  Left kidney  DISPOSITION OF SPECIMEN:  PATHOLOGY  COUNTS:  YES  TOURNIQUET:  * No tourniquets in log *  DICTATION: .Other Dictation: Dictation Number [id number]  PLAN OF CARE: Admit to inpatient   PATIENT DISPOSITION:  PACU - hemodynamically stable.   Delay start of Pharmacological VTE agent (>24hrs) due to surgical blood loss or risk of bleeding: no

## 2015-03-10 NOTE — Transfer of Care (Signed)
Immediate Anesthesia Transfer of Care Note  Patient: Claire Humphrey  Procedure(s) Performed: Procedure(s): LAPAROSCOPIC LEFT NEPHRECTOMY (Left)  Patient Location: PACU  Anesthesia Type:General  Level of Consciousness:  sedated, patient cooperative and responds to stimulation  Airway & Oxygen Therapy:Patient Spontanous Breathing and Patient connected to face mask oxgen  Post-op Assessment:  Report given to PACU RN and Post -op Vital signs reviewed and stable  Post vital signs:  Reviewed and stable  Last Vitals:  Filed Vitals:   03/10/15 0950  BP: 149/88  Pulse: 85  Temp: 36.8 C  Resp: 16    Complications: No apparent anesthesia complications

## 2015-03-11 ENCOUNTER — Encounter (HOSPITAL_COMMUNITY): Payer: Self-pay | Admitting: Urology

## 2015-03-11 LAB — CBC
HCT: 38.1 % (ref 36.0–46.0)
Hemoglobin: 12.2 g/dL (ref 12.0–15.0)
MCH: 28.9 pg (ref 26.0–34.0)
MCHC: 32 g/dL (ref 30.0–36.0)
MCV: 90.3 fL (ref 78.0–100.0)
PLATELETS: 235 10*3/uL (ref 150–400)
RBC: 4.22 MIL/uL (ref 3.87–5.11)
RDW: 13.5 % (ref 11.5–15.5)
WBC: 8.1 10*3/uL (ref 4.0–10.5)

## 2015-03-11 LAB — BASIC METABOLIC PANEL
Anion gap: 4 — ABNORMAL LOW (ref 5–15)
BUN: 14 mg/dL (ref 6–20)
CHLORIDE: 105 mmol/L (ref 101–111)
CO2: 27 mmol/L (ref 22–32)
CREATININE: 1.23 mg/dL — AB (ref 0.44–1.00)
Calcium: 8.5 mg/dL — ABNORMAL LOW (ref 8.9–10.3)
GFR calc non Af Amer: 45 mL/min — ABNORMAL LOW (ref >60)
GFR, EST AFRICAN AMERICAN: 53 mL/min — AB (ref >60)
Glucose, Bld: 160 mg/dL — ABNORMAL HIGH (ref 65–99)
Potassium: 4.4 mmol/L (ref 3.5–5.1)
SODIUM: 136 mmol/L (ref 135–145)

## 2015-03-11 NOTE — Progress Notes (Signed)
1 Day Post-Op Subjective: Patient reports incisional pain. No acute events overnight. Hemoglobin stable  Objective: Vital signs in last 24 hours: Temp:  [97.3 F (36.3 C)-98.2 F (36.8 C)] 97.8 F (36.6 C) (06/14 0627) Pulse Rate:  [55-85] 77 (06/14 0627) Resp:  [11-19] 19 (06/14 0627) BP: (99-151)/(55-88) 103/59 mmHg (06/14 0627) SpO2:  [98 %-100 %] 99 % (06/14 0627) Weight:  [63.05 kg (139 lb)-64.048 kg (141 lb 3.2 oz)] 64.048 kg (141 lb 3.2 oz) (06/13 1711)  Intake/Output from previous day: 06/13 0701 - 06/14 0700 In: 3915 [I.V.:3815; IV Piggyback:100] Out: 32 [Urine:440; Blood:50] Intake/Output this shift:    Physical Exam:  General:alert, cooperative and appears stated age GI: not done, soft, non tender, normal bowel sounds, no palpable masses, no organomegaly, no inguinal hernia and tenderness: LLQ Female genitalia: not done Resp: clear to auscultation bilaterally Cardio: regular rate and rhythm, S1, S2 normal, no murmur, click, rub or gallop Extremities: extremities normal, atraumatic, no cyanosis or edema  Lab Results:  Recent Labs  03/10/15 1630 03/10/15 1830 03/11/15 0420  HGB 13.4 13.3 12.2  HCT 41.8 41.3 38.1   BMET  Recent Labs  03/10/15 1830 03/11/15 0420  NA  --  136  K  --  4.4  CL  --  105  CO2  --  27  GLUCOSE  --  160*  BUN  --  14  CREATININE 1.00 1.23*  CALCIUM  --  8.5*   No results for input(s): LABPT, INR in the last 72 hours. No results for input(s): LABURIN in the last 72 hours. No results found for this or any previous visit.  Studies/Results: No results found.  Assessment/Plan: 1 Day Post-Op Procedure(s) (LRB): LAPAROSCOPIC LEFT NEPHRECTOMY (Left)  CLD, ambulate, d/c bedrest, d/cfoley  Advance diet as tolerated   LOS: 1 day   Anzley Dibbern L 03/11/2015, 8:06 AM

## 2015-03-11 NOTE — Care Management Note (Signed)
Case Management Note  Patient Details  Name: Claire Humphrey MRN: [id number] Date of Birth: [birth year]  Subjective/Objective:  64 y/o f admitted w/L renal mass.POD#1 L nephrectomy.From home.                  Action/Plan:d/c plan home. No anticipated d/c needs.   Expected Discharge Date:                  Expected Discharge Plan:  Home/Self Care  In-House Referral:     Discharge planning Services  CM Consult  Post Acute Care Choice:    Choice offered to:     DME Arranged:    DME Agency:     HH Arranged:    HH Agency:     Status of Service:  In process, will continue to follow  Medicare Important Message Given:    Date Medicare IM Given:    Medicare IM give by:    Date Additional Medicare IM Given:    Additional Medicare Important Message give by:     If discussed at [place] of Stay Meetings, dates discussed:    Additional Comments:  Dessa Phi, RN 03/11/2015, 9:45 AM

## 2015-03-12 NOTE — Progress Notes (Signed)
2 Days Post-Op Subjective: Patient reports incisional pain. Pain improved. No acute events overnight.   Objective: Vital signs in last 24 hours: Temp:  [98.3 F (36.8 C)-99.3 F (37.4 C)] 98.7 F (37.1 C) (06/15 0401) Pulse Rate:  [70-77] 75 (06/15 0401) Resp:  [16-20] 16 (06/15 0401) BP: (118-121)/(58-66) 118/64 mmHg (06/15 0401) SpO2:  [94 %-96 %] 96 % (06/15 0401)  Intake/Output from previous day: 06/14 0701 - 06/15 0700 In: 3993.3 [P.O.:1610; I.V.:2383.3] Out: 375 [Urine:375] Intake/Output this shift: Total I/O In: -  Out: 400 [Urine:400]  Physical Exam:  General:alert, cooperative and appears stated age GI: not done, soft, non tender, normal bowel sounds, no palpable masses, no organomegaly, no inguinal hernia and tenderness: LLQ Female genitalia: not done Resp: clear to auscultation bilaterally Cardio: regular rate and rhythm, S1, S2 normal, no murmur, click, rub or gallop Extremities: extremities normal, atraumatic, no cyanosis or edema  Lab Results:  Recent Labs  03/10/15 1630 03/10/15 1830 03/11/15 0420  HGB 13.4 13.3 12.2  HCT 41.8 41.3 38.1   BMET  Recent Labs  03/10/15 1830 03/11/15 0420  NA  --  136  K  --  4.4  CL  --  105  CO2  --  27  GLUCOSE  --  160*  BUN  --  14  CREATININE 1.00 1.23*  CALCIUM  --  8.5*   No results for input(s): LABPT, INR in the last 72 hours. No results for input(s): LABURIN in the last 72 hours. No results found for this or any previous visit.  Studies/Results: No results found.  Assessment/Plan: 2 Days Post-Op Procedure(s) (LRB): LAPAROSCOPIC LEFT NEPHRECTOMY (Left)  Regular diet  Ambulate in halls  Decrease IVF to 50cc/hr  Likely discharge tomorrow   LOS: 2 days   Ovide Dusek L 03/12/2015, 12:35 PM

## 2015-03-13 MED ORDER — OXYCODONE-ACETAMINOPHEN 5-325 MG PO TABS: 1.0000 | ORAL_TABLET | ORAL | Status: DC | PRN

## 2015-03-13 MED ORDER — DOCUSATE SODIUM 100 MG PO CAPS: 100.0000 mg | ORAL_CAPSULE | Freq: Two times a day (BID) | ORAL | Status: DC

## 2015-03-13 NOTE — Care Management Note (Signed)
Case Management Note  Patient Details  Name: Daphene Chisholm MRN: [id number] Date of Birth: 01-Jan-1951  Subjective/Objective:                    Action/Plan:d/c plan home. No needs or orders.   Expected Discharge Date:                  Expected Discharge Plan:  Home/Self Care  In-House Referral:     Discharge planning Services  CM Consult  Post Acute Care Choice:    Choice offered to:     DME Arranged:    DME Agency:     HH Arranged:    [place] Agency:     Status of Service:  Completed, signed off  Medicare Important Message Given:    Date Medicare IM Given:    Medicare IM give by:    Date Additional Medicare IM Given:    Additional Medicare Important Message give by:     If discussed at [place] of Stay Meetings, dates discussed:    Additional Comments:  Dessa Phi, RN 03/13/2015, 9:46 AM

## 2015-03-13 NOTE — Discharge Summary (Signed)
Physician Discharge Summary  Patient ID: Claire Humphrey MRN: [id number] DOB/AGE: May 04, 1951 64 y.o.  Admit date: 03/10/2015 Discharge date: 03/13/2015  Admission Diagnoses:  Discharge Diagnoses:  Active Problems:   Renal mass, left   Discharged Condition: good  Hospital Course: The patient tolerated the procedure well and was transferred to the floor on IV pain meds, IV fluid. On POD#1 foley was removed, pt was started on clear liquid diet and they ambulated in the halls. On POD#2 the patient was transitioned to a regular diet, IVFs were discontinued, and the patient passed flatus. Prior to discharge the pt was tolerating a regular diet, pain was controlled on PO pain meds, they were ambulating without difficulty, and they had normal bowel function.  Consults: None  Significant Diagnostic Studies: labs: hemoglobin 13.3  Treatments: IV hydration and analgesia: Dilaudid and percocet  Discharge Exam: Blood pressure 125/63, pulse 85, temperature 97.6 F (36.4 C), temperature source Oral, resp. rate 16, height 5\' 4"  (1.626 m), weight 64.048 kg (141 lb 3.2 oz), SpO2 97 %. General appearance: alert, cooperative, appears stated age and no distress Head: Normocephalic, without obvious abnormality, atraumatic Eyes: conjunctivae/corneas clear. PERRL, EOM's intact. Fundi benign. Ears: normal TM's and external ear canals both ears Nose: Nares normal. Septum midline. Mucosa normal. No drainage or sinus tenderness. Neck: no adenopathy, no carotid bruit, no JVD, supple, symmetrical, trachea midline and thyroid not enlarged, symmetric, no tenderness/mass/nodules Resp: clear to auscultation bilaterally and normal percussion bilaterally Chest wall: no tenderness Cardio: regular rate and rhythm, S1, S2 normal, no murmur, click, rub or gallop GI: soft, non-tender; bowel sounds normal; no masses,  no organomegaly Extremities: extremities normal, atraumatic, no cyanosis or edema Pulses: 2+ and  symmetric  Disposition: Final discharge disposition not confirmed  Discharge Instructions    Call MD for:  difficulty breathing, headache or visual disturbances    Complete by:  As directed      Call MD for:  extreme fatigue    Complete by:  As directed      Call MD for:  persistant dizziness or light-headedness    Complete by:  As directed      Call MD for:  persistant nausea and vomiting    Complete by:  As directed      Call MD for:  redness, tenderness, or signs of infection (pain, swelling, redness, odor or green/yellow discharge around incision site)    Complete by:  As directed      Call MD for:  severe uncontrolled pain    Complete by:  As directed      Call MD for:  temperature >100.4    Complete by:  As directed      Diet general    Complete by:  As directed      Discharge instructions    Complete by:  As directed   Do not lift more than 10 pounds for 6 weeks     Increase activity slowly    Complete by:  As directed             Medication List    TAKE these medications        aspirin 81 MG tablet  Take 81 mg by mouth daily.     CALCIUM 600 + D PO  Take 1 tablet by mouth 2 (two) times daily.     docusate sodium 100 MG capsule  Commonly known as:  COLACE  Take 1 capsule (100 mg total) by mouth 2 (two) times daily.  oxyCODONE-acetaminophen 5-325 MG per tablet  Commonly known as:  PERCOCET/ROXICET  Take 1-2 tablets by mouth every 4 (four) hours as needed for moderate pain.     psyllium 58.6 % powder  Commonly known as:  METAMUCIL  Take 1 packet by mouth 2 (two) times daily.           Follow-up Information    Follow up with Asyia Hornung L, MD. Call in 1 week.   Specialty:  Urology   Why:  For wound re-check. Schedule at [facility] office   Contact information:   [address] [place] [address] [phone]       Signed: Cleon Gustin 03/13/2015, 9:01 AM

## 2015-04-02 NOTE — Op Note (Signed)
Preoperative diagnosis: Left renal mass  Postop diagnosis: Same  Procedure: 1.  Left laparoscopic radical nephrectomy 2.  Lysis of adhesions, moderate   Attending: Nicolette Bang  Anesthesia: General  Estimated blood loss: 50 cc  Drains: 16 French Foley catheter  Specimens: Left radical nephrectomy and adrenal  Antibiotics: Ancef  Findings: Moderate amount of anterior abdominal wall adhesions from previous laparoscopic surgery.  One renal artery one renal vein.  Indications: Patient is a 64 year old with a history of 7 cm left renal mass.  The mass was not amenable to partial nephrectomy.  After discussing treatment options patient decided to proceed with left laparoscopic radical nephrectomy.  Procedure in detail: Prior to procedure consent was obtained. Patient was brought to the operating room and briefing was done sure correct patient, correct procedure, correct site.  General anesthesia was in administered patient was placed in the right lateral decubitus position.  Her Foley a 61 French catheter was placed.  Her abdomen and flank was then prepped and draped usual sterile fashion.  A Veress needle was used to obtain pneumoperitoneum.  Once pneumoperitoneum was reestablished to 15 mmHg we then placed a 12 mm camera port.  We then proceeded to place 3 more ports each being 10-12 mm.  We then started this dissection very by removing a moderate amount of anterior abdominal wall adhesions from her previous laparoscopic surgery.  We then dissected along the white line of Toldt.  We then reflected the colon medially.  We then identified the psoas muscle.  Once this was done we traced it down to the iliac vessels and identified the ureter.  Once we identified the gonadal vein and ureter were then traced to the renal hilum.  The renal vein and renal artery were skeletonized.  We did we identified one renal vein one renal artery.  Using the [employer] power stapler within ligated the renal artery.   Once this was done we then used a second staple load to ligate the renal vein.  We then used a tissue load to ligate the gonadal vein and the ureter.  Once this was done we then freed the kidney from its lateral and posterior attachments.  We then used a Endo Catch bag to remove the specimen.  Once the specimen was in the Endo Catch bag we then inspected the retroperitoneum and noted no residual oozing.  We then removed our instruments and released the pneumoperitoneum.  Through the left lower port we extended the incision to remove the specimen.  Once the specimen was removed we then closed the 10 inch and 12 mm camera and assistant ports with 0 Vicryl in interrupted fashion.  We then closed the left lower quadrant incision with looped PDS in a running fashion.  We then closed the overlying skin with 2-0 Vicryl in running fashion.  These skin was then subcuticularly closed with 4-0 Monocryl.  We then placed Dermabond over all the incisions.  This included the procedure which resulted by the patient.  Complications: None  Condition: Stable, x-rayed, transferred to PACU.  Plan: Patient is to be admitted for inpatient stay.  Postoperative day 1 where to remove her Foley catheter and start her on a regular diet.  She is doing to be discharged on postoperative day 2.

## 2015-06-16 ENCOUNTER — Ambulatory Visit (HOSPITAL_COMMUNITY)
Admission: RE | Admit: 2015-06-16 | Discharge: 2015-06-16 | Disposition: A | Discharge status: Home / Self Care | Payer: BLUE CROSS/BLUE SHIELD | Source: Ambulatory Visit | Attending: Urology | Admitting: Urology

## 2015-06-16 ENCOUNTER — Other Ambulatory Visit: Payer: Self-pay | Admitting: Urology

## 2015-06-16 DIAGNOSIS — C642 Malignant neoplasm of left kidney, except renal pelvis: Secondary | ICD-10-CM

## 2015-07-03 DIAGNOSIS — C642 Malignant neoplasm of left kidney, except renal pelvis: Secondary | ICD-10-CM | POA: Diagnosis not present

## 2015-07-03 DIAGNOSIS — R5383 Other fatigue: Secondary | ICD-10-CM | POA: Diagnosis not present

## 2015-08-06 DIAGNOSIS — H04123 Dry eye syndrome of bilateral lacrimal glands: Secondary | ICD-10-CM | POA: Diagnosis not present

## 2015-08-08 DIAGNOSIS — Z23 Encounter for immunization: Secondary | ICD-10-CM | POA: Diagnosis not present

## 2015-08-08 DIAGNOSIS — Z136 Encounter for screening for cardiovascular disorders: Secondary | ICD-10-CM | POA: Diagnosis not present

## 2015-08-08 DIAGNOSIS — Z0000 Encounter for general adult medical examination without abnormal findings: Secondary | ICD-10-CM | POA: Diagnosis not present

## 2015-08-08 DIAGNOSIS — F39 Unspecified mood [affective] disorder: Secondary | ICD-10-CM | POA: Diagnosis not present

## 2015-08-08 DIAGNOSIS — C642 Malignant neoplasm of left kidney, except renal pelvis: Secondary | ICD-10-CM | POA: Diagnosis not present

## 2015-08-08 DIAGNOSIS — K219 Gastro-esophageal reflux disease without esophagitis: Secondary | ICD-10-CM | POA: Diagnosis not present

## 2015-08-08 DIAGNOSIS — R030 Elevated blood-pressure reading, without diagnosis of hypertension: Secondary | ICD-10-CM | POA: Diagnosis not present

## 2015-08-08 DIAGNOSIS — E041 Nontoxic single thyroid nodule: Secondary | ICD-10-CM | POA: Diagnosis not present

## 2015-09-18 DIAGNOSIS — Z803 Family history of malignant neoplasm of breast: Secondary | ICD-10-CM | POA: Diagnosis not present

## 2015-09-18 DIAGNOSIS — Z1231 Encounter for screening mammogram for malignant neoplasm of breast: Secondary | ICD-10-CM | POA: Diagnosis not present

## 2015-10-02 ENCOUNTER — Other Ambulatory Visit: Payer: Self-pay | Admitting: Urology

## 2015-10-02 ENCOUNTER — Ambulatory Visit (HOSPITAL_COMMUNITY)
Admission: RE | Admit: 2015-10-02 | Discharge: 2015-10-02 | Disposition: A | Discharge status: Home / Self Care | Payer: Medicare Other | Source: Ambulatory Visit | Attending: Urology | Admitting: Urology

## 2015-10-02 DIAGNOSIS — Z0000 Encounter for general adult medical examination without abnormal findings: Secondary | ICD-10-CM | POA: Diagnosis not present

## 2015-10-02 DIAGNOSIS — C642 Malignant neoplasm of left kidney, except renal pelvis: Secondary | ICD-10-CM | POA: Diagnosis not present

## 2015-10-09 DIAGNOSIS — Z0000 Encounter for general adult medical examination without abnormal findings: Secondary | ICD-10-CM | POA: Diagnosis not present

## 2015-10-09 DIAGNOSIS — C642 Malignant neoplasm of left kidney, except renal pelvis: Secondary | ICD-10-CM | POA: Diagnosis not present

## 2015-11-21 DIAGNOSIS — D225 Melanocytic nevi of trunk: Secondary | ICD-10-CM | POA: Diagnosis not present

## 2015-11-21 DIAGNOSIS — L570 Actinic keratosis: Secondary | ICD-10-CM | POA: Diagnosis not present

## 2015-11-21 DIAGNOSIS — L821 Other seborrheic keratosis: Secondary | ICD-10-CM | POA: Diagnosis not present

## 2015-11-21 DIAGNOSIS — D1801 Hemangioma of skin and subcutaneous tissue: Secondary | ICD-10-CM | POA: Diagnosis not present

## 2015-11-21 DIAGNOSIS — L710 Perioral dermatitis: Secondary | ICD-10-CM | POA: Diagnosis not present

## 2015-11-21 DIAGNOSIS — L814 Other melanin hyperpigmentation: Secondary | ICD-10-CM | POA: Diagnosis not present

## 2015-11-21 DIAGNOSIS — D1721 Benign lipomatous neoplasm of skin and subcutaneous tissue of right arm: Secondary | ICD-10-CM | POA: Diagnosis not present

## 2015-11-21 DIAGNOSIS — L720 Epidermal cyst: Secondary | ICD-10-CM | POA: Diagnosis not present

## 2015-11-21 DIAGNOSIS — L438 Other lichen planus: Secondary | ICD-10-CM | POA: Diagnosis not present

## 2015-11-21 DIAGNOSIS — Z85828 Personal history of other malignant neoplasm of skin: Secondary | ICD-10-CM | POA: Diagnosis not present

## 2016-01-29 ENCOUNTER — Ambulatory Visit (HOSPITAL_COMMUNITY)
Admission: RE | Admit: 2016-01-29 | Discharge: 2016-01-29 | Disposition: A | Discharge status: Home / Self Care | Payer: Medicare Other | Source: Ambulatory Visit | Attending: Urology | Admitting: Urology

## 2016-01-29 ENCOUNTER — Other Ambulatory Visit: Payer: Self-pay | Admitting: Urology

## 2016-01-29 DIAGNOSIS — C642 Malignant neoplasm of left kidney, except renal pelvis: Secondary | ICD-10-CM

## 2016-01-29 DIAGNOSIS — R918 Other nonspecific abnormal finding of lung field: Secondary | ICD-10-CM | POA: Diagnosis not present

## 2016-02-05 DIAGNOSIS — C642 Malignant neoplasm of left kidney, except renal pelvis: Secondary | ICD-10-CM | POA: Diagnosis not present

## 2016-02-05 DIAGNOSIS — Z0000 Encounter for general adult medical examination without abnormal findings: Secondary | ICD-10-CM | POA: Diagnosis not present

## 2016-06-02 DIAGNOSIS — L239 Allergic contact dermatitis, unspecified cause: Secondary | ICD-10-CM | POA: Diagnosis not present

## 2016-06-02 DIAGNOSIS — L570 Actinic keratosis: Secondary | ICD-10-CM | POA: Diagnosis not present

## 2016-06-02 DIAGNOSIS — L710 Perioral dermatitis: Secondary | ICD-10-CM | POA: Diagnosis not present

## 2016-06-02 DIAGNOSIS — Z85828 Personal history of other malignant neoplasm of skin: Secondary | ICD-10-CM | POA: Diagnosis not present

## 2016-08-05 ENCOUNTER — Ambulatory Visit (HOSPITAL_COMMUNITY)
Admission: RE | Admit: 2016-08-05 | Discharge: 2016-08-05 | Disposition: A | Discharge status: Home / Self Care | Payer: Medicare Other | Source: Ambulatory Visit | Attending: Urology | Admitting: Urology

## 2016-08-05 ENCOUNTER — Other Ambulatory Visit: Payer: Self-pay | Admitting: Urology

## 2016-08-05 DIAGNOSIS — C642 Malignant neoplasm of left kidney, except renal pelvis: Secondary | ICD-10-CM | POA: Diagnosis not present

## 2016-08-05 DIAGNOSIS — Z85528 Personal history of other malignant neoplasm of kidney: Secondary | ICD-10-CM | POA: Diagnosis not present

## 2016-08-05 DIAGNOSIS — Z0389 Encounter for observation for other suspected diseases and conditions ruled out: Secondary | ICD-10-CM | POA: Diagnosis not present

## 2016-08-10 DIAGNOSIS — F39 Unspecified mood [affective] disorder: Secondary | ICD-10-CM | POA: Diagnosis not present

## 2016-08-10 DIAGNOSIS — Z23 Encounter for immunization: Secondary | ICD-10-CM | POA: Diagnosis not present

## 2016-08-10 DIAGNOSIS — C642 Malignant neoplasm of left kidney, except renal pelvis: Secondary | ICD-10-CM | POA: Diagnosis not present

## 2016-08-10 DIAGNOSIS — E041 Nontoxic single thyroid nodule: Secondary | ICD-10-CM | POA: Diagnosis not present

## 2016-08-10 DIAGNOSIS — K219 Gastro-esophageal reflux disease without esophagitis: Secondary | ICD-10-CM | POA: Diagnosis not present

## 2016-08-10 DIAGNOSIS — Z0000 Encounter for general adult medical examination without abnormal findings: Secondary | ICD-10-CM | POA: Diagnosis not present

## 2016-08-12 DIAGNOSIS — C642 Malignant neoplasm of left kidney, except renal pelvis: Secondary | ICD-10-CM | POA: Diagnosis not present

## 2016-08-16 DIAGNOSIS — Z8371 Family history of colonic polyps: Secondary | ICD-10-CM | POA: Diagnosis not present

## 2016-08-16 DIAGNOSIS — Z85528 Personal history of other malignant neoplasm of kidney: Secondary | ICD-10-CM | POA: Diagnosis not present

## 2016-09-08 DIAGNOSIS — K5730 Diverticulosis of large intestine without perforation or abscess without bleeding: Secondary | ICD-10-CM | POA: Diagnosis not present

## 2016-09-08 DIAGNOSIS — Z1211 Encounter for screening for malignant neoplasm of colon: Secondary | ICD-10-CM | POA: Diagnosis not present

## 2016-09-08 DIAGNOSIS — Z8371 Family history of colonic polyps: Secondary | ICD-10-CM | POA: Diagnosis not present

## 2016-09-22 DIAGNOSIS — Z1231 Encounter for screening mammogram for malignant neoplasm of breast: Secondary | ICD-10-CM | POA: Diagnosis not present

## 2016-09-22 DIAGNOSIS — Z803 Family history of malignant neoplasm of breast: Secondary | ICD-10-CM | POA: Diagnosis not present

## 2016-09-28 DIAGNOSIS — L570 Actinic keratosis: Secondary | ICD-10-CM | POA: Diagnosis not present

## 2016-09-28 DIAGNOSIS — Z85828 Personal history of other malignant neoplasm of skin: Secondary | ICD-10-CM | POA: Diagnosis not present

## 2016-10-15 IMAGING — CR DG CHEST 2V
2 series · 2 of 2 positions shown · non-contrast
Comparison: None.

CLINICAL DATA: Ureteral fistula.

EXAM:
CHEST  2 VIEW

[w chest pa]
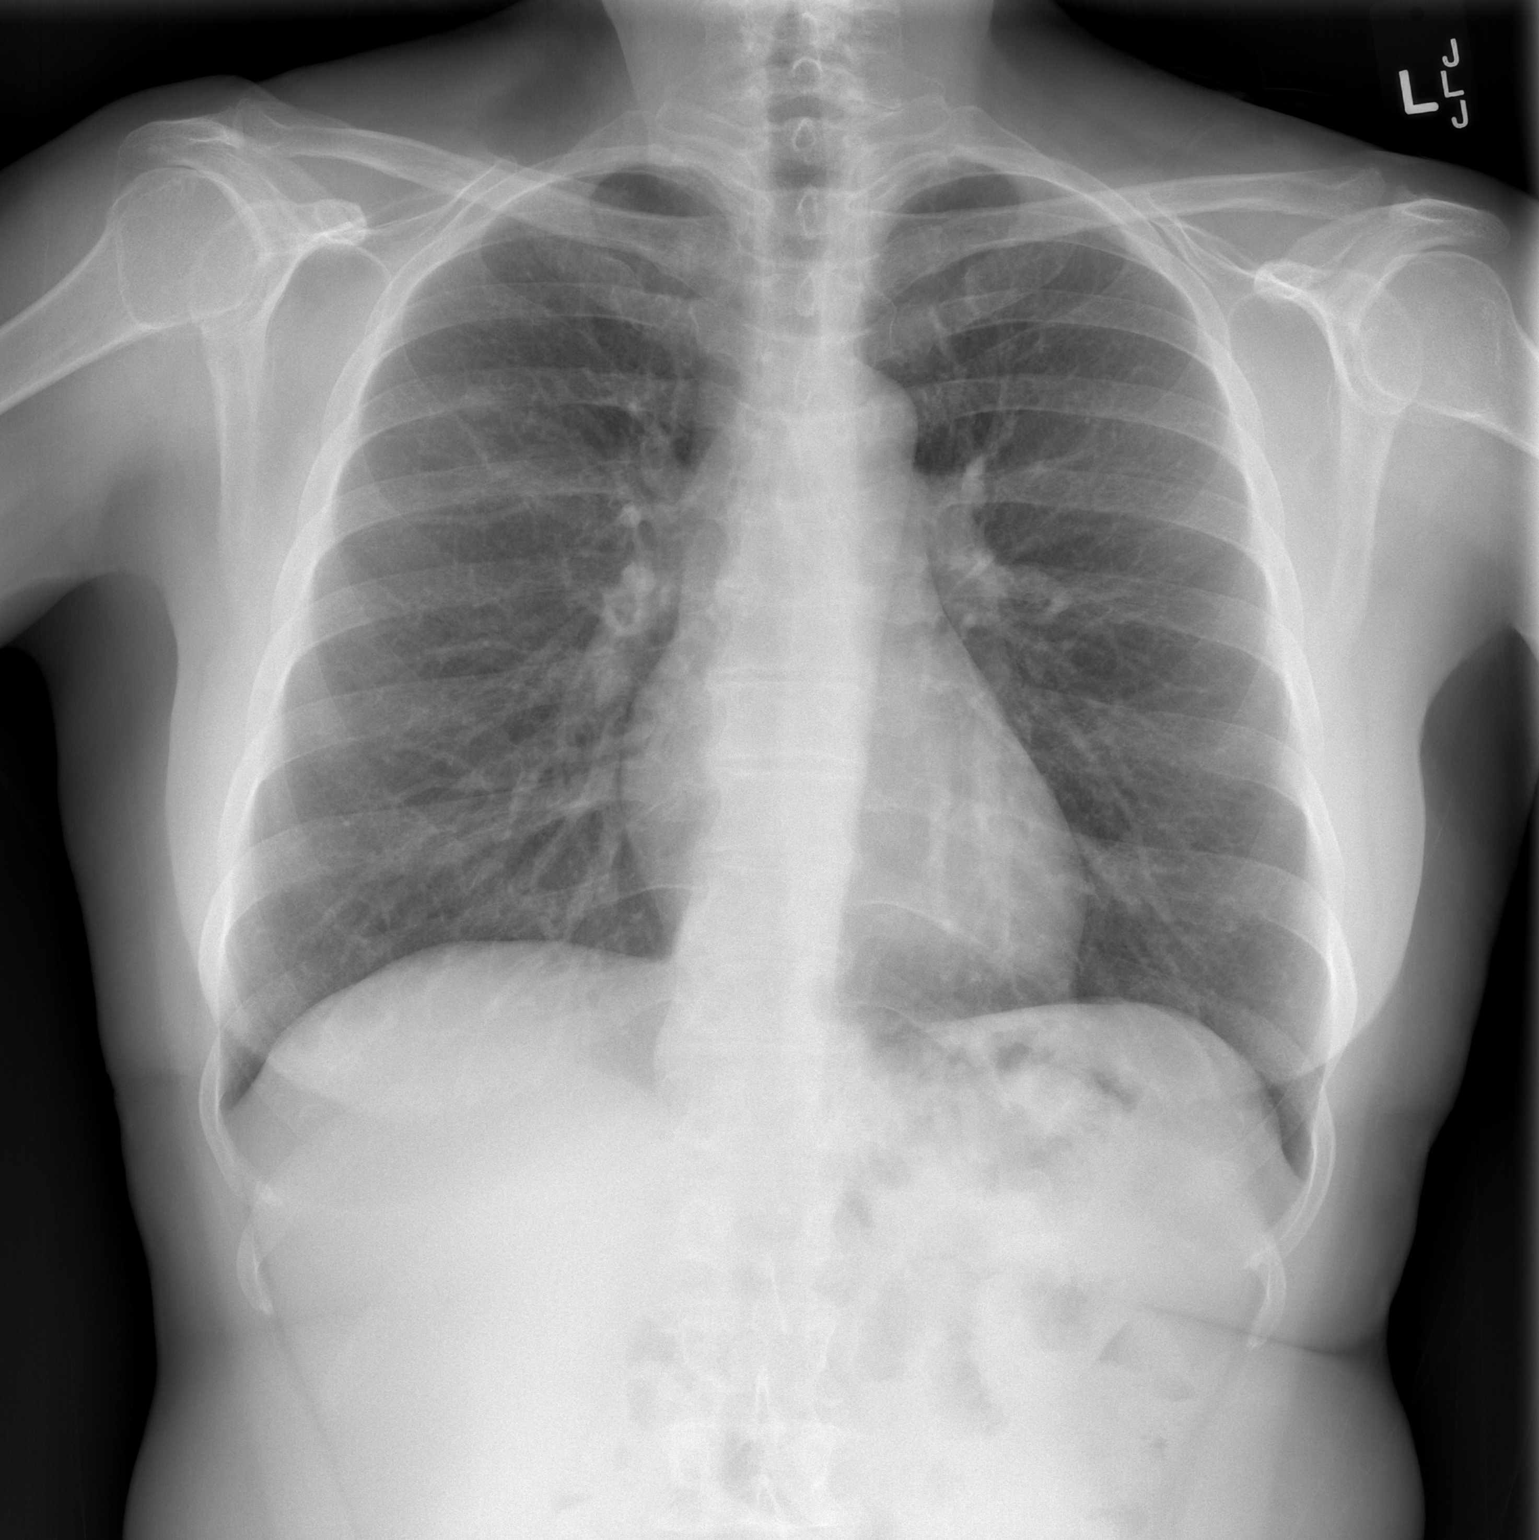

[w chest lat]
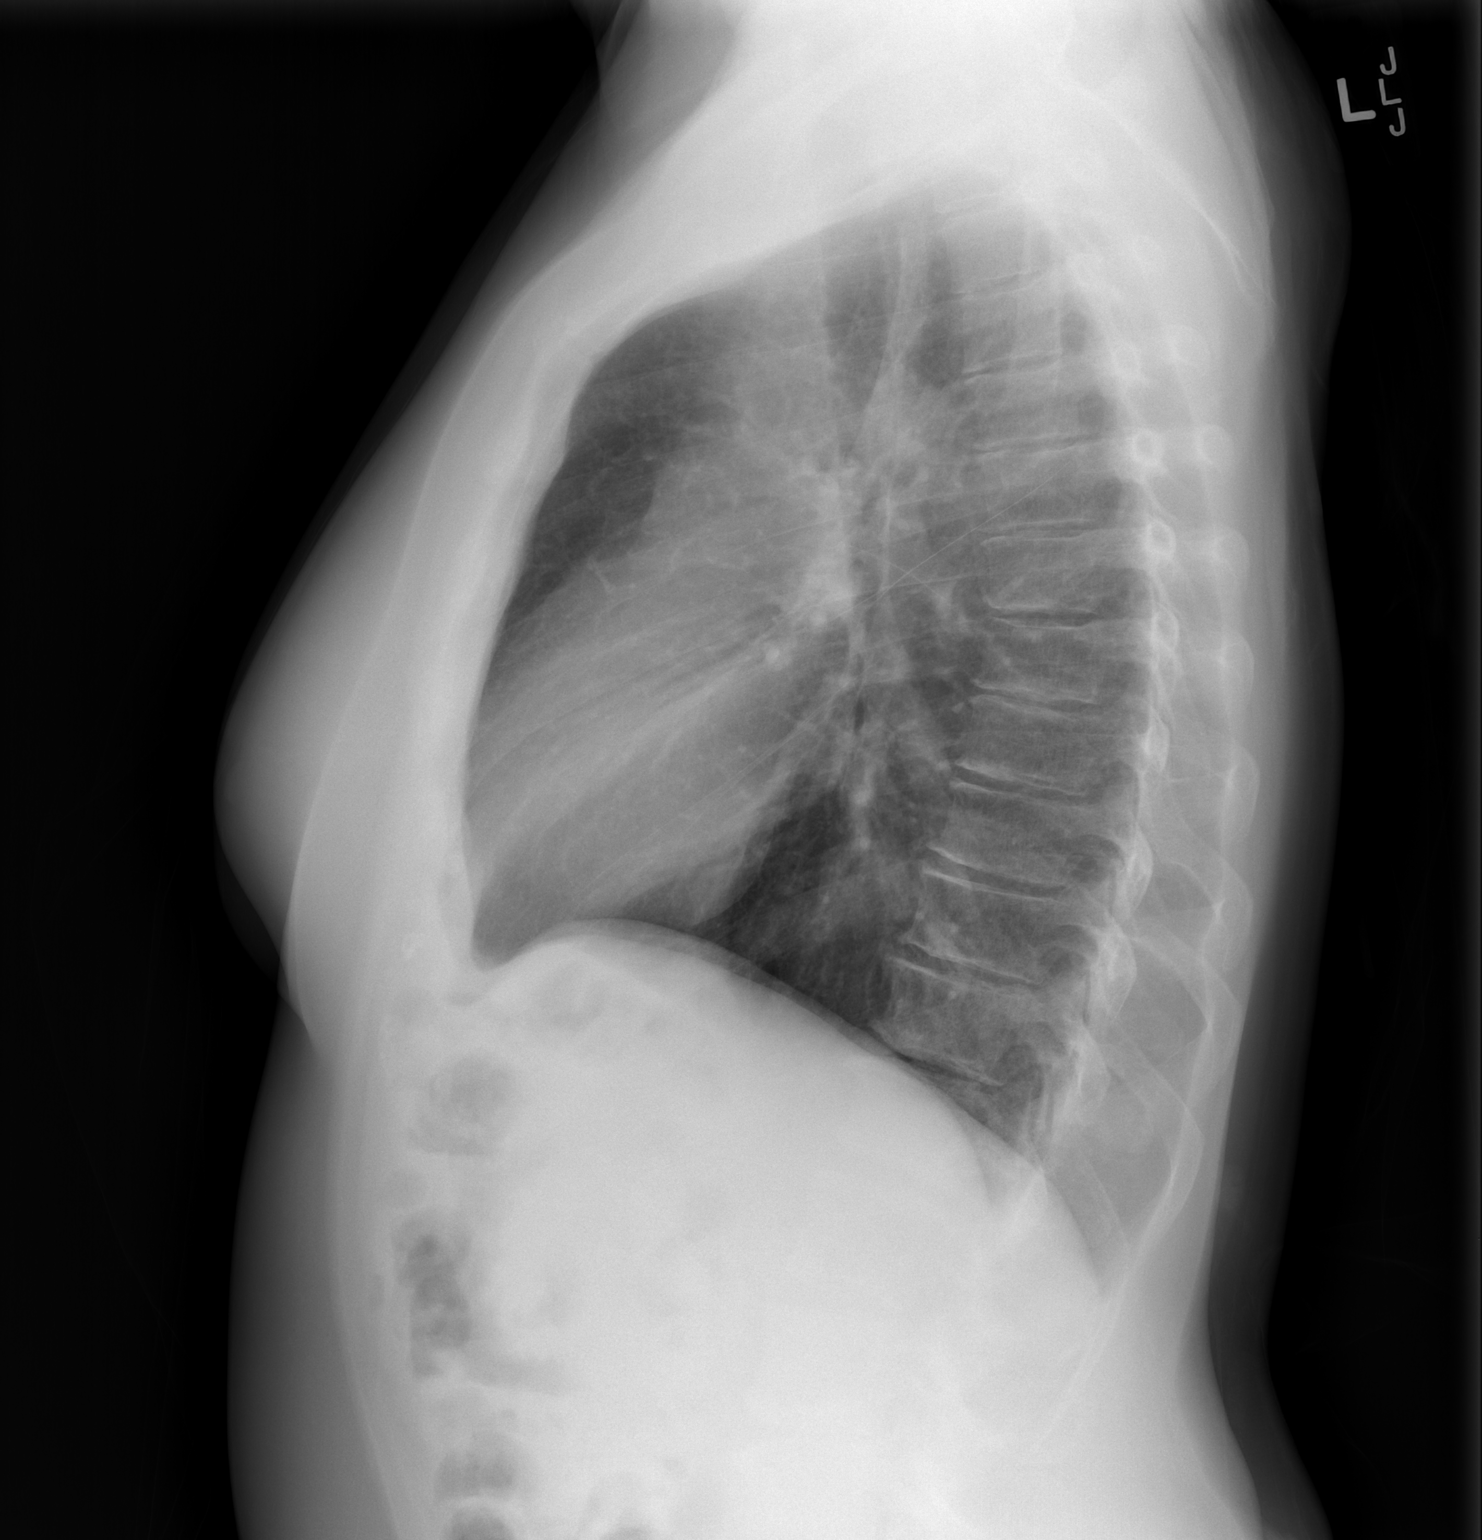

[2 of 2 positions shown; findings below may reference images not displayed]

FINDINGS: Mediastinum and hilar structures are normal. Nodular density noted
projected over the right upper lobe. No pleural effusion or
pneumothorax. Heart size normal. No acute bony abnormality.
IMPRESSION: Nodular density noted projected over the right upper lobe.
Nonenhanced chest CT is suggested to further evaluate. These results
will be called to the ordering clinician or representative by the
Radiologist Assistant, and communication documented in the PACS or
zVision Dashboard.

## 2016-11-26 DIAGNOSIS — L821 Other seborrheic keratosis: Secondary | ICD-10-CM | POA: Diagnosis not present

## 2016-11-26 DIAGNOSIS — D1801 Hemangioma of skin and subcutaneous tissue: Secondary | ICD-10-CM | POA: Diagnosis not present

## 2016-11-26 DIAGNOSIS — Z85828 Personal history of other malignant neoplasm of skin: Secondary | ICD-10-CM | POA: Diagnosis not present

## 2016-11-26 DIAGNOSIS — D225 Melanocytic nevi of trunk: Secondary | ICD-10-CM | POA: Diagnosis not present

## 2016-11-26 DIAGNOSIS — L814 Other melanin hyperpigmentation: Secondary | ICD-10-CM | POA: Diagnosis not present

## 2016-11-26 DIAGNOSIS — D2221 Melanocytic nevi of right ear and external auricular canal: Secondary | ICD-10-CM | POA: Diagnosis not present

## 2016-11-26 DIAGNOSIS — I788 Other diseases of capillaries: Secondary | ICD-10-CM | POA: Diagnosis not present

## 2016-11-26 DIAGNOSIS — L718 Other rosacea: Secondary | ICD-10-CM | POA: Diagnosis not present

## 2016-11-26 DIAGNOSIS — D2262 Melanocytic nevi of left upper limb, including shoulder: Secondary | ICD-10-CM | POA: Diagnosis not present

## 2017-02-15 ENCOUNTER — Other Ambulatory Visit: Payer: Self-pay | Admitting: Urology

## 2017-02-15 ENCOUNTER — Ambulatory Visit (HOSPITAL_COMMUNITY)
Admission: RE | Admit: 2017-02-15 | Discharge: 2017-02-15 | Disposition: A | Discharge status: Home / Self Care | Payer: Medicare Other | Source: Ambulatory Visit | Attending: Urology | Admitting: Urology

## 2017-02-15 DIAGNOSIS — R918 Other nonspecific abnormal finding of lung field: Secondary | ICD-10-CM | POA: Insufficient documentation

## 2017-02-15 DIAGNOSIS — C642 Malignant neoplasm of left kidney, except renal pelvis: Secondary | ICD-10-CM | POA: Diagnosis not present

## 2017-02-15 DIAGNOSIS — R911 Solitary pulmonary nodule: Secondary | ICD-10-CM | POA: Diagnosis not present

## 2017-02-22 DIAGNOSIS — C642 Malignant neoplasm of left kidney, except renal pelvis: Secondary | ICD-10-CM | POA: Diagnosis not present

## 2017-02-28 DIAGNOSIS — N3946 Mixed incontinence: Secondary | ICD-10-CM | POA: Diagnosis not present

## 2017-02-28 DIAGNOSIS — R102 Pelvic and perineal pain: Secondary | ICD-10-CM | POA: Diagnosis not present

## 2017-02-28 DIAGNOSIS — M62838 Other muscle spasm: Secondary | ICD-10-CM | POA: Diagnosis not present

## 2017-02-28 DIAGNOSIS — M6281 Muscle weakness (generalized): Secondary | ICD-10-CM | POA: Diagnosis not present

## 2017-03-09 DIAGNOSIS — M6281 Muscle weakness (generalized): Secondary | ICD-10-CM | POA: Diagnosis not present

## 2017-03-09 DIAGNOSIS — N3946 Mixed incontinence: Secondary | ICD-10-CM | POA: Diagnosis not present

## 2017-03-09 DIAGNOSIS — M62838 Other muscle spasm: Secondary | ICD-10-CM | POA: Diagnosis not present

## 2017-03-09 DIAGNOSIS — R102 Pelvic and perineal pain: Secondary | ICD-10-CM | POA: Diagnosis not present

## 2017-04-13 DIAGNOSIS — M71342 Other bursal cyst, left hand: Secondary | ICD-10-CM | POA: Diagnosis not present

## 2017-04-13 DIAGNOSIS — M151 Heberden's nodes (with arthropathy): Secondary | ICD-10-CM | POA: Diagnosis not present

## 2017-05-24 DIAGNOSIS — L710 Perioral dermatitis: Secondary | ICD-10-CM | POA: Diagnosis not present

## 2017-05-24 DIAGNOSIS — L821 Other seborrheic keratosis: Secondary | ICD-10-CM | POA: Diagnosis not present

## 2017-05-24 DIAGNOSIS — Z85828 Personal history of other malignant neoplasm of skin: Secondary | ICD-10-CM | POA: Diagnosis not present

## 2017-05-24 DIAGNOSIS — L820 Inflamed seborrheic keratosis: Secondary | ICD-10-CM | POA: Diagnosis not present

## 2017-05-24 DIAGNOSIS — L570 Actinic keratosis: Secondary | ICD-10-CM | POA: Diagnosis not present

## 2017-06-14 DIAGNOSIS — M71342 Other bursal cyst, left hand: Secondary | ICD-10-CM | POA: Diagnosis not present

## 2017-06-14 DIAGNOSIS — Z85528 Personal history of other malignant neoplasm of kidney: Secondary | ICD-10-CM | POA: Diagnosis not present

## 2017-06-14 DIAGNOSIS — M19042 Primary osteoarthritis, left hand: Secondary | ICD-10-CM | POA: Diagnosis not present

## 2017-06-20 DIAGNOSIS — Z23 Encounter for immunization: Secondary | ICD-10-CM | POA: Diagnosis not present

## 2017-06-20 DIAGNOSIS — R5383 Other fatigue: Secondary | ICD-10-CM | POA: Diagnosis not present

## 2017-06-20 DIAGNOSIS — R239 Unspecified skin changes: Secondary | ICD-10-CM | POA: Diagnosis not present

## 2017-06-29 DIAGNOSIS — M19042 Primary osteoarthritis, left hand: Secondary | ICD-10-CM | POA: Diagnosis not present

## 2017-07-27 DIAGNOSIS — M19042 Primary osteoarthritis, left hand: Secondary | ICD-10-CM | POA: Diagnosis not present

## 2017-08-16 ENCOUNTER — Other Ambulatory Visit: Payer: Self-pay | Admitting: Family Medicine

## 2017-08-16 DIAGNOSIS — E041 Nontoxic single thyroid nodule: Secondary | ICD-10-CM | POA: Diagnosis not present

## 2017-08-16 DIAGNOSIS — R5383 Other fatigue: Secondary | ICD-10-CM | POA: Diagnosis not present

## 2017-08-16 DIAGNOSIS — C642 Malignant neoplasm of left kidney, except renal pelvis: Secondary | ICD-10-CM | POA: Diagnosis not present

## 2017-08-16 DIAGNOSIS — Z01419 Encounter for gynecological examination (general) (routine) without abnormal findings: Secondary | ICD-10-CM | POA: Diagnosis not present

## 2017-08-16 DIAGNOSIS — Z0000 Encounter for general adult medical examination without abnormal findings: Secondary | ICD-10-CM | POA: Diagnosis not present

## 2017-08-16 DIAGNOSIS — F39 Unspecified mood [affective] disorder: Secondary | ICD-10-CM | POA: Diagnosis not present

## 2017-08-16 DIAGNOSIS — Z1389 Encounter for screening for other disorder: Secondary | ICD-10-CM | POA: Diagnosis not present

## 2017-08-16 DIAGNOSIS — K219 Gastro-esophageal reflux disease without esophagitis: Secondary | ICD-10-CM | POA: Diagnosis not present

## 2017-08-22 ENCOUNTER — Ambulatory Visit
Admission: RE | Admit: 2017-08-22 | Discharge: 2017-08-22 | Disposition: A | Discharge status: Home / Self Care | Payer: Medicare Other | Source: Ambulatory Visit | Attending: Family Medicine | Admitting: Family Medicine

## 2017-08-22 DIAGNOSIS — E042 Nontoxic multinodular goiter: Secondary | ICD-10-CM | POA: Diagnosis not present

## 2017-08-22 DIAGNOSIS — E041 Nontoxic single thyroid nodule: Secondary | ICD-10-CM

## 2017-08-22 DIAGNOSIS — C642 Malignant neoplasm of left kidney, except renal pelvis: Secondary | ICD-10-CM | POA: Diagnosis not present

## 2017-08-24 DIAGNOSIS — C642 Malignant neoplasm of left kidney, except renal pelvis: Secondary | ICD-10-CM | POA: Diagnosis not present

## 2017-08-24 DIAGNOSIS — N281 Cyst of kidney, acquired: Secondary | ICD-10-CM | POA: Diagnosis not present

## 2017-08-25 ENCOUNTER — Encounter: Payer: Self-pay | Admitting: Cardiology

## 2017-08-25 ENCOUNTER — Ambulatory Visit (INDEPENDENT_AMBULATORY_CARE_PROVIDER_SITE_OTHER): Payer: Medicare Other | Admitting: Cardiology

## 2017-08-25 VITALS — BP 144/86 | HR 81 | Ht 64.0 in | Wt 135.2 lb

## 2017-08-25 DIAGNOSIS — R012 Other cardiac sounds: Secondary | ICD-10-CM | POA: Diagnosis not present

## 2017-08-25 DIAGNOSIS — R5383 Other fatigue: Secondary | ICD-10-CM | POA: Diagnosis not present

## 2017-08-25 NOTE — Progress Notes (Signed)
PCP: Kelton Pillar, MD  Clinic Note: Chief Complaint  Patient presents with  . New Patient (Initial Visit)    Fatigue    HPI: Claire Humphrey is a 66 y.o. female who is being seen today for the evaluation of fatigue at the request of Kelton Pillar, MD.  Claire Humphrey was last seen on July 16, 2017 by Dr. Laurann Montana.  She noted extreme fatigue.  Relatively normal TSH therefore referred to cardiology for evaluation of possible cardiac etiology for fatigue.  Recent Hospitalizations: None  Studies Personally Reviewed - (if available, images/films reviewed: From Epic Chart or Care Everywhere)  None  Interval History: Claire Humphrey is a very pleasant healthy woman without any significant cardiac risk factors other than her mother having coronary disease in her 32s.  She presents here for evaluation of fatigue.  She also notes having some cold intolerance.  Dry scaly skin on her hands and feet.  Also hoarseness in her throat.  This is all suggestive of possible thyroid disease, but apparently her thyroid levels are okay.  She notes reduced energy, but denies any exertional dyspnea or chest discomfort. Her blood pressure little high today, but usually is well controlled and then 332-951 mm systolic.  She walks 4-5 days a week for 40-45 minutes at a time.  She does walking and elliptical.  She denies any chest tightness or pressure.  No exertional dyspnea.  No PND orthopnea or edema.  No lightheadedness, dizziness, weakness or syncope/near syncope.  Occasional intermittent palpitations. No TIA/amaurosis fugax symptoms.  No claudication.  ROS: A comprehensive was performed. Review of Systems  Constitutional: Positive for malaise/fatigue.  HENT: Negative for nosebleeds.   Gastrointestinal: Negative for blood in stool, constipation and melena.  Genitourinary: Negative for dysuria and hematuria.  Neurological: Negative for dizziness and weakness.       She has some mild neuropathic symptoms from  where her nephrectomy was.  She feels these occasional jolting/clutching type pains but not significant.  Psychiatric/Behavioral: Negative for memory loss. The patient is not nervous/anxious and does not have insomnia (She feels that she sleeps quite well.).   All other systems reviewed and are negative.   I have reviewed and (if needed) personally updated the patient's problem list, medications, allergies, past medical and surgical history, social and family history.   Past Medical History:  Diagnosis Date  . Arthritis    hands   . Fibrocystic breast changes   . GERD (gastroesophageal reflux disease)    hx of   . Goiter    multinodular thyroid   . History of renal cell carcinoma 2016   S/P post left nephrectomy  . Migraines   . PTSD (post-traumatic stress disorder)    hx of   . Rosacea   . Thyroid nodule, cold 2001   Transient hypothyroidism in 2001    Past Surgical History:  Procedure Laterality Date  . bilateral carpal tunnel surgery     . LAPAROSCOPIC NEPHRECTOMY Left 03/10/2015   Procedure: LAPAROSCOPIC LEFT NEPHRECTOMY;  Surgeon: Cleon Gustin, MD;  Location: [facility];  Service: Urology;  Laterality: Left;  . laparoscopic surgery      due to ovarian cysts- 1990   . left index finger titanium screw surgery     . Left ovarian cyst     Resection  . right middle finger cyst removal     . TUBAL LIGATION      Current Meds  Medication Sig  . aspirin 81 MG tablet Take 81 mg  by mouth daily.    Allergies  Allergen Reactions  . Dilaudid  [Hydromorphone Hcl] Other (See Comments)  . Adhesive [Tape] Other (See Comments)    redness  . Ibuprofen Other (See Comments)  . Nsaids   . Latex Rash  . Sulfur Rash    Social History   Socioeconomic History  . Marital status: Married    Spouse name: None  . Number of children: 2  . Years of education: None  . Highest education level: Some college, no degree  Social Needs  . Financial resource strain: None  . Food  insecurity - worry: None  . Food insecurity - inability: None  . Transportation needs - medical: None  . Transportation needs - non-medical: None  Occupational History  . Occupation: Copywriter, advertising    Comment: retired  Tobacco Use  . Smoking status: Never Smoker  . Smokeless tobacco: Never Used  Substance and Sexual Activity  . Alcohol use: Yes    Comment: occasional wine or beer   . Drug use: No  . Sexual activity: None  Other Topics Concern  . None  Social History Narrative   She is a married mother of 2.  She is retired Lobbyist.  She retired in 2016 following her nephrectomy.  She initially went back to work, but then her husband needed to have knee surgery and she never started back after that.    family history includes Arrhythmia in her sister; Breast cancer in her mother; CAD in her mother; Colon polyps in her mother; Diabetes in her mother; Heart failure (age of onset: 45) in her mother; Hyperlipidemia in her mother; Leukemia in her father; Prostate cancer in her father; Renal cancer in her sister; Stroke (age of onset: 21) in her paternal grandfather; Thyroid disease in her sister; Transient ischemic attack in her mother.  Wt Readings from Last 3 Encounters:  08/25/17 135 lb 3.2 oz (61.3 kg)  03/10/15 141 lb 3.2 oz (64 kg)  03/07/15 139 lb (63 kg)    PHYSICAL EXAM BP (!) 144/86   Pulse 81   Ht 5\' 4"  (1.626 m)   Wt 135 lb 3.2 oz (61.3 kg)   BMI 23.21 kg/m  Physical Exam  Constitutional: She is oriented to person, place, and time. She appears well-developed and well-nourished. No distress.  Healthy-appearing  HENT:  Head: Normocephalic and atraumatic.  Eyes: Conjunctivae and EOM are normal. Pupils are equal, round, and reactive to light. No scleral icterus.  Neck: Normal range of motion. Neck supple. No hepatojugular reflux and no JVD present. Carotid bruit is not present.  Cardiovascular: Normal rate, regular rhythm, S1 normal, S2 normal and  intact distal pulses.  No extrasystoles are present. PMI is not displaced. Exam reveals gallop (Soft S4 gallop versus click). Exam reveals no friction rub.  No murmur heard. Cannot exclude mild click  Pulmonary/Chest: Effort normal and breath sounds normal. No respiratory distress. She has no wheezes. She has no rales.  Abdominal: Soft. Bowel sounds are normal. She exhibits no distension. There is no tenderness. There is no rebound.  Musculoskeletal: Normal range of motion. She exhibits no edema or deformity.  Neurological: She is alert and oriented to person, place, and time. No cranial nerve deficit.  Skin: Skin is warm and dry. No rash noted. No erythema.  Psychiatric: She has a normal mood and affect. Her behavior is normal. Judgment and thought content normal.  Nursing note and vitals reviewed.    Adult ECG Report  Rate: 81 ;  Rhythm: normal sinus rhythm, sinus arrhythmia and Normal axis, intervals and durations;   Narrative Interpretation: Stable/normal EKG.  Other studies Reviewed: Additional studies/ records that were reviewed today include:  Recent Labs:  Return 24 2018: --BUN/creatinine 15/1.01 glucose 102.  Normal electrolytes and LFTs.  Normal CBC.  ASSESSMENT / PLAN: Fatigue really from an unknown cause.  Very unlikely for her to be cardiac if she is not having any cardiac symptoms including arrhythmias or syncope/near syncope.  We will check a 2D echocardiogram just to evaluate cardiac function and for possible mitral prolapse.  Problem List Items Addressed This Visit    Abnormal fourth heart sound (S4)    Possible fourth heart sound versus midsystolic click.  She could have a mitral valve click.  We will check 2D echo to exclude mitral prolapse.      Relevant Orders   EKG 12-Lead (Completed)   ECHOCARDIOGRAM COMPLETE   Fatigue - Primary    Really difficult to figure out why she is having fatigue from a cardiac standpoint.  We will check a 2D echo just to ensure that she  has normal function.  But she is able to do plenty of exercise without any significant dyspnea.  I would therefore suggest that it is probably not related to a cardiac issue. If she is not having any significant arrhythmias to suggest that she could be going in and out of arrhythmias.      Relevant Orders   EKG 12-Lead (Completed)   ECHOCARDIOGRAM COMPLETE     Current medicines are reviewed at length with the patient today. (+/- concerns) NON The following changes have been made:  NONE  Patient Instructions  NO CHANGE WITH CURRENT MEDICATIONS   SCHEDULE AT [place] [facility] Your physician has requested that you have an echocardiogram. Echocardiography is a painless test that uses sound waves to create images of your heart. It provides your doctor with information about the size and shape of your heart and how well your heart's chambers and valves are working. This procedure takes approximately one hour. There are no restrictions for this procedure.   Your physician recommends that you schedule a follow-up appointment in [place].  Studies Ordered:   Orders Placed This Encounter  Procedures  . EKG 12-Lead  . ECHOCARDIOGRAM COMPLETE      Glenetta Hew, M.D., M.S. Interventional Cardiologist   Pager # [phone] Phone # [phone] [address] [place], [place] [address]

## 2017-08-25 NOTE — Patient Instructions (Addendum)
NO CHANGE WITH CURRENT MEDICATIONS   SCHEDULE AT [place] Your physician has requested that you have an echocardiogram. Echocardiography is a painless test that uses sound waves to create images of your heart. It provides your doctor with information about the size and shape of your heart and how well your heart's chambers and valves are working. This procedure takes approximately one hour. There are no restrictions for this procedure.   Your physician recommends that you schedule a follow-up appointment in [place].

## 2017-08-27 ENCOUNTER — Encounter: Payer: Self-pay | Admitting: Cardiology

## 2017-08-27 NOTE — Assessment & Plan Note (Signed)
Possible fourth heart sound versus midsystolic click.  She could have a mitral valve click.  We will check 2D echo to exclude mitral prolapse.

## 2017-08-27 NOTE — Assessment & Plan Note (Signed)
Really difficult to figure out why she is having fatigue from a cardiac standpoint.  We will check a 2D echo just to ensure that she has normal function.  But she is able to do plenty of exercise without any significant dyspnea.  I would therefore suggest that it is probably not related to a cardiac issue. If she is not having any significant arrhythmias to suggest that she could be going in and out of arrhythmias.

## 2017-08-31 ENCOUNTER — Other Ambulatory Visit: Payer: Self-pay

## 2017-08-31 ENCOUNTER — Ambulatory Visit (HOSPITAL_COMMUNITY): Payer: Medicare Other | Attending: Cardiology

## 2017-08-31 DIAGNOSIS — Z8249 Family history of ischemic heart disease and other diseases of the circulatory system: Secondary | ICD-10-CM | POA: Diagnosis not present

## 2017-08-31 DIAGNOSIS — R012 Other cardiac sounds: Secondary | ICD-10-CM | POA: Diagnosis not present

## 2017-08-31 DIAGNOSIS — Z85528 Personal history of other malignant neoplasm of kidney: Secondary | ICD-10-CM | POA: Insufficient documentation

## 2017-08-31 DIAGNOSIS — Z905 Acquired absence of kidney: Secondary | ICD-10-CM | POA: Insufficient documentation

## 2017-08-31 DIAGNOSIS — R5383 Other fatigue: Secondary | ICD-10-CM | POA: Insufficient documentation

## 2017-08-31 DIAGNOSIS — G43909 Migraine, unspecified, not intractable, without status migrainosus: Secondary | ICD-10-CM | POA: Diagnosis not present

## 2017-08-31 DIAGNOSIS — R011 Cardiac murmur, unspecified: Secondary | ICD-10-CM | POA: Insufficient documentation

## 2017-09-01 DIAGNOSIS — C642 Malignant neoplasm of left kidney, except renal pelvis: Secondary | ICD-10-CM | POA: Diagnosis not present

## 2017-09-22 ENCOUNTER — Telehealth: Payer: Self-pay | Admitting: Cardiology

## 2017-09-22 NOTE — Telephone Encounter (Signed)
Spoke to patient , aware of echo result, no question  Patient states dr stated if echo stable. no follow is needed.   she is cancelling f/u appointment.  rn cancelled appointment.

## 2017-09-22 NOTE — Telephone Encounter (Signed)
New Message  Pt call requesting to speak with RN to see if it would be okay to cancel appt with Dr. Ellyn Hack for 1/10, she states due to good Echo results.. Please call back to discuss

## 2017-09-28 DIAGNOSIS — Z1231 Encounter for screening mammogram for malignant neoplasm of breast: Secondary | ICD-10-CM | POA: Diagnosis not present

## 2017-10-06 ENCOUNTER — Ambulatory Visit: Payer: Medicare Other | Admitting: Cardiology

## 2017-10-12 NOTE — Progress Notes (Signed)
Patient ID: Claire Humphrey, female   DOB: August 01, 1951, 67 y.o.   MRN: [id number]          Reason for Appointment: Thyroid evaluation, new consultation    History of Present Illness:   Patient has been referred by Dr. Kelton Pillar  CHIEF COMPLAINT: Tiredness  She thinks she has had increased tiredness and fatigue since early 2018 Although she may wake up not  tired she gets tired more easily and feels like sitting down, also may at times feels more sleepy She is still able to exercise without getting weak She is concerned that she is also having sensitive the cold which is new and also dry skin especially on her hands and feet She has had only a little weight gain recently but this is from her not watching her diet as closely She was evaluated by her PCP in November and also the PA in 9/18 when her TSH was 0.97   GOITER: The patient's thyroid enlargement was first discovered in 2001 she felt knot in her neck on the left side Details are not available but was evaluated with ultrasound exams but no biopsy done Subsequently has had periodic ultrasound exams  She has had no difficulty with swallowing  Does not feel like she has any choking sensation in her neck or pressure She has had an ultrasound exam in 11/18 which showed the following  1.1 cm right superior TR 3 nodule does not meet criteria for any biopsy or follow-up.  0.8 cm isthmus TR 4 nodule also does not meet the criteria for biopsy or follow-up.  Overall stable thyroid ultrasound    Wt Readings from Last 3 Encounters:  10/13/17 138 lb 2 oz (62.7 kg)  08/25/17 135 lb 3.2 oz (61.3 kg)  03/10/15 141 lb 3.2 oz (64 kg)    Allergies as of 10/13/2017      Reactions   Dilaudid  [hydromorphone Hcl] Other (See Comments)   Adhesive [tape] Other (See Comments)   redness   Ibuprofen Other (See Comments)   Nsaids    Latex Rash   Sulfur Rash      Medication List        Accurate as of 10/13/17  2:18 PM. Always use your  most recent med list.          aspirin 81 MG tablet Take 81 mg by mouth daily.   Psyllium Husk Powd Take 1 Scoop by mouth at bedtime.       Allergies:  Allergies  Allergen Reactions  . Dilaudid  [Hydromorphone Hcl] Other (See Comments)  . Adhesive [Tape] Other (See Comments)    redness  . Ibuprofen Other (See Comments)  . Nsaids   . Latex Rash  . Sulfur Rash    Past Medical History:  Diagnosis Date  . Arthritis    hands   . Fibrocystic breast changes   . GERD (gastroesophageal reflux disease)    hx of   . Goiter    multinodular thyroid   . History of renal cell carcinoma 2016   S/P post left nephrectomy  . Migraines   . PTSD (post-traumatic stress disorder)    hx of   . Rosacea   . Thyroid nodule, cold 2001   Transient hypothyroidism in 2001    Past Surgical History:  Procedure Laterality Date  . bilateral carpal tunnel surgery     . LAPAROSCOPIC NEPHRECTOMY Left 03/10/2015   Procedure: LAPAROSCOPIC LEFT NEPHRECTOMY;  Surgeon: Cleon Gustin, MD;  Location:  [facility];  Service: Urology;  Laterality: Left;  . laparoscopic surgery      due to ovarian cysts- 1990   . left index finger titanium screw surgery     . Left ovarian cyst     Resection  . right middle finger cyst removal     . TUBAL LIGATION      Family History  Problem Relation Age of Onset  . CAD Mother        History of CABG  . Diabetes Mother   . Hyperlipidemia Mother   . Transient ischemic attack Mother   . Breast cancer Mother   . Colon polyps Mother   . Heart failure Mother 3  . Leukemia Father   . Prostate cancer Father   . Renal cancer Sister   . Stroke Paternal Grandfather 3       Died at age 35  . Thyroid disease Sister        Younger sister thyroid Ca  . Arrhythmia Sister     Social History:  reports that  has never smoked. she has never used smokeless tobacco. She reports that she drinks alcohol. She reports that she does not use drugs.   Review of  Systems:  Review of Systems  Constitutional:       Has had about a 3-4 pounds weight gain over the holidays  HENT: Negative for headaches and trouble swallowing.   Eyes: Negative for blurred vision.  Respiratory: Negative for shortness of breath.   Cardiovascular: Negative for palpitations.       Blood pressure is usually high in the office but not at home, followed by PCP  Gastrointestinal: Negative for constipation.  Endocrine: Positive for fatigue.       She went through menopause at age 1, not having hot flashes now  Musculoskeletal:       Has had some joint pain in her fingers  Skin: Positive for dry skin.       Dry skin mostly on hands and feet. Hair is dry, no hair loss. Has some loss of hair of her eyebrows and eyelashes   Neurological: Negative for weakness and numbness.  Psychiatric/Behavioral: Negative for depressed mood and insomnia.      Examination:   BP (!) 150/94 (BP Location: Left Arm, Patient Position: Sitting, Cuff Size: Normal)   Pulse 74   Temp 97.9 F (36.6 C) (Oral)   Ht 5' 4.25" (1.632 m)   Wt 138 lb 2 oz (62.7 kg)   SpO2 98%   BMI 23.52 kg/m    General Appearance: pleasant,          Eyes: No abnormal prominence or eyelid swelling.    Oral mucosa and tongue are normal        Neck: The thyroid is not enlarged but there is a slightly firm once in meter nodules felt on swallowing on the right lobe.  There is no lymphadenopathy.     Cardiovascular: Normal  heart sounds, no murmur Respiratory:  Lungs clear  Abdomen: No hepatosplenomegaly or other mass  Neurological: REFLEXES: at biceps are normal.  Skin: no rash      Extremities: She has mild osteoarthritic changes in her finger joints  Assessment/Plan:  FATIGUE: Etiology is somewhat unclear She does have symptoms suggestive of hypothyroidism with fatigability, some daytime somnolence and cold intolerance but no clear-cut history of insomnia or depression Also no suggestive sleep apnea  although this has not been evaluated She is able to exercise and  maintaining her weight usually Her exam is unremarkable  Although her TSH has been normal in 05/2017 her low normal Discussed that there may be a chance that she has secondary hypothyroidism and this needs to be ruled out  Will check free T4 level today  If this is low normal or low may try thyroid supplementation empirically Also may consider evaluation for panhypopituitarism if needed However she has no abnormality of her thyroid levels today she needs to follow-up with her PCP and consider evaluation for sleep apnea or empirically trying Wellbutrin  Multinodular goiter: She has very small and thyroid nodules and agree with PCP that no further evaluation is needed The nodule is only barely palpable clinically on the right side   A copy of the consultation note is sent to the referring physician  Elayne Snare 10/13/2017

## 2017-10-13 ENCOUNTER — Encounter: Payer: Self-pay | Admitting: Endocrinology

## 2017-10-13 ENCOUNTER — Ambulatory Visit (INDEPENDENT_AMBULATORY_CARE_PROVIDER_SITE_OTHER): Payer: Medicare Other | Admitting: Endocrinology

## 2017-10-13 VITALS — BP 150/94 | HR 74 | Temp 97.9°F | Ht 64.25 in | Wt 138.1 lb

## 2017-10-13 DIAGNOSIS — E038 Other specified hypothyroidism: Secondary | ICD-10-CM

## 2017-10-13 DIAGNOSIS — E042 Nontoxic multinodular goiter: Secondary | ICD-10-CM | POA: Diagnosis not present

## 2017-10-13 DIAGNOSIS — R5383 Other fatigue: Secondary | ICD-10-CM

## 2017-10-13 LAB — T4, FREE: FREE T4: 0.84 ng/dL (ref 0.60–1.60)

## 2017-11-29 DIAGNOSIS — D225 Melanocytic nevi of trunk: Secondary | ICD-10-CM | POA: Diagnosis not present

## 2017-11-29 DIAGNOSIS — L578 Other skin changes due to chronic exposure to nonionizing radiation: Secondary | ICD-10-CM | POA: Diagnosis not present

## 2017-11-29 DIAGNOSIS — L718 Other rosacea: Secondary | ICD-10-CM | POA: Diagnosis not present

## 2017-11-29 DIAGNOSIS — L821 Other seborrheic keratosis: Secondary | ICD-10-CM | POA: Diagnosis not present

## 2017-11-29 DIAGNOSIS — L814 Other melanin hyperpigmentation: Secondary | ICD-10-CM | POA: Diagnosis not present

## 2017-11-29 DIAGNOSIS — Z85828 Personal history of other malignant neoplasm of skin: Secondary | ICD-10-CM | POA: Diagnosis not present

## 2017-11-29 DIAGNOSIS — L570 Actinic keratosis: Secondary | ICD-10-CM | POA: Diagnosis not present

## 2017-11-29 DIAGNOSIS — D1801 Hemangioma of skin and subcutaneous tissue: Secondary | ICD-10-CM | POA: Diagnosis not present

## 2018-03-02 ENCOUNTER — Ambulatory Visit (HOSPITAL_COMMUNITY)
Admission: RE | Admit: 2018-03-02 | Discharge: 2018-03-02 | Disposition: A | Discharge status: Home / Self Care | Payer: Medicare Other | Source: Ambulatory Visit | Attending: Urology | Admitting: Urology

## 2018-03-02 ENCOUNTER — Other Ambulatory Visit: Payer: Self-pay | Admitting: Urology

## 2018-03-02 DIAGNOSIS — Z85528 Personal history of other malignant neoplasm of kidney: Secondary | ICD-10-CM | POA: Diagnosis not present

## 2018-03-02 DIAGNOSIS — C642 Malignant neoplasm of left kidney, except renal pelvis: Secondary | ICD-10-CM | POA: Diagnosis not present

## 2018-03-07 DIAGNOSIS — C642 Malignant neoplasm of left kidney, except renal pelvis: Secondary | ICD-10-CM | POA: Diagnosis not present

## 2018-08-17 DIAGNOSIS — E559 Vitamin D deficiency, unspecified: Secondary | ICD-10-CM | POA: Diagnosis not present

## 2018-08-17 DIAGNOSIS — E041 Nontoxic single thyroid nodule: Secondary | ICD-10-CM | POA: Diagnosis not present

## 2018-08-17 DIAGNOSIS — Z8371 Family history of colonic polyps: Secondary | ICD-10-CM | POA: Diagnosis not present

## 2018-08-17 DIAGNOSIS — Z85528 Personal history of other malignant neoplasm of kidney: Secondary | ICD-10-CM | POA: Diagnosis not present

## 2018-08-17 DIAGNOSIS — F39 Unspecified mood [affective] disorder: Secondary | ICD-10-CM | POA: Diagnosis not present

## 2018-08-17 DIAGNOSIS — K219 Gastro-esophageal reflux disease without esophagitis: Secondary | ICD-10-CM | POA: Diagnosis not present

## 2018-08-17 DIAGNOSIS — Z23 Encounter for immunization: Secondary | ICD-10-CM | POA: Diagnosis not present

## 2018-08-17 DIAGNOSIS — R030 Elevated blood-pressure reading, without diagnosis of hypertension: Secondary | ICD-10-CM | POA: Diagnosis not present

## 2018-08-17 DIAGNOSIS — Z1159 Encounter for screening for other viral diseases: Secondary | ICD-10-CM | POA: Diagnosis not present

## 2018-08-17 DIAGNOSIS — Z0000 Encounter for general adult medical examination without abnormal findings: Secondary | ICD-10-CM | POA: Diagnosis not present

## 2018-08-17 DIAGNOSIS — Z1389 Encounter for screening for other disorder: Secondary | ICD-10-CM | POA: Diagnosis not present

## 2018-08-21 DIAGNOSIS — M85852 Other specified disorders of bone density and structure, left thigh: Secondary | ICD-10-CM | POA: Diagnosis not present

## 2018-08-21 DIAGNOSIS — Z8262 Family history of osteoporosis: Secondary | ICD-10-CM | POA: Diagnosis not present

## 2018-08-21 DIAGNOSIS — Z780 Asymptomatic menopausal state: Secondary | ICD-10-CM | POA: Diagnosis not present

## 2018-08-21 DIAGNOSIS — Z85528 Personal history of other malignant neoplasm of kidney: Secondary | ICD-10-CM | POA: Diagnosis not present

## 2018-09-07 DIAGNOSIS — C642 Malignant neoplasm of left kidney, except renal pelvis: Secondary | ICD-10-CM | POA: Diagnosis not present

## 2018-09-13 DIAGNOSIS — K862 Cyst of pancreas: Secondary | ICD-10-CM | POA: Diagnosis not present

## 2018-09-13 DIAGNOSIS — C642 Malignant neoplasm of left kidney, except renal pelvis: Secondary | ICD-10-CM | POA: Diagnosis not present

## 2018-09-25 DIAGNOSIS — C642 Malignant neoplasm of left kidney, except renal pelvis: Secondary | ICD-10-CM | POA: Diagnosis not present

## 2018-09-29 DIAGNOSIS — Z1231 Encounter for screening mammogram for malignant neoplasm of breast: Secondary | ICD-10-CM | POA: Diagnosis not present

## 2018-12-05 DIAGNOSIS — L814 Other melanin hyperpigmentation: Secondary | ICD-10-CM | POA: Diagnosis not present

## 2018-12-05 DIAGNOSIS — D045 Carcinoma in situ of skin of trunk: Secondary | ICD-10-CM | POA: Diagnosis not present

## 2018-12-05 DIAGNOSIS — D225 Melanocytic nevi of trunk: Secondary | ICD-10-CM | POA: Diagnosis not present

## 2018-12-05 DIAGNOSIS — L718 Other rosacea: Secondary | ICD-10-CM | POA: Diagnosis not present

## 2018-12-05 DIAGNOSIS — D1801 Hemangioma of skin and subcutaneous tissue: Secondary | ICD-10-CM | POA: Diagnosis not present

## 2018-12-05 DIAGNOSIS — L570 Actinic keratosis: Secondary | ICD-10-CM | POA: Diagnosis not present

## 2018-12-05 DIAGNOSIS — L720 Epidermal cyst: Secondary | ICD-10-CM | POA: Diagnosis not present

## 2018-12-05 DIAGNOSIS — L821 Other seborrheic keratosis: Secondary | ICD-10-CM | POA: Diagnosis not present

## 2018-12-05 DIAGNOSIS — D485 Neoplasm of uncertain behavior of skin: Secondary | ICD-10-CM | POA: Diagnosis not present

## 2018-12-05 DIAGNOSIS — Z85828 Personal history of other malignant neoplasm of skin: Secondary | ICD-10-CM | POA: Diagnosis not present

## 2018-12-12 DIAGNOSIS — L718 Other rosacea: Secondary | ICD-10-CM | POA: Diagnosis not present

## 2018-12-12 DIAGNOSIS — D045 Carcinoma in situ of skin of trunk: Secondary | ICD-10-CM | POA: Diagnosis not present

## 2018-12-12 DIAGNOSIS — Z85828 Personal history of other malignant neoplasm of skin: Secondary | ICD-10-CM | POA: Diagnosis not present

## 2018-12-12 DIAGNOSIS — L438 Other lichen planus: Secondary | ICD-10-CM | POA: Diagnosis not present

## 2019-02-27 ENCOUNTER — Other Ambulatory Visit: Payer: Self-pay

## 2019-02-27 ENCOUNTER — Other Ambulatory Visit (HOSPITAL_COMMUNITY): Payer: Self-pay | Admitting: Urology

## 2019-02-27 ENCOUNTER — Ambulatory Visit (HOSPITAL_COMMUNITY)
Admission: RE | Admit: 2019-02-27 | Discharge: 2019-02-27 | Disposition: A | Discharge status: Home / Self Care | Payer: Medicare Other | Source: Ambulatory Visit | Attending: Urology | Admitting: Urology

## 2019-02-27 DIAGNOSIS — C642 Malignant neoplasm of left kidney, except renal pelvis: Secondary | ICD-10-CM

## 2019-02-27 DIAGNOSIS — C649 Malignant neoplasm of unspecified kidney, except renal pelvis: Secondary | ICD-10-CM | POA: Diagnosis not present

## 2019-03-06 DIAGNOSIS — C642 Malignant neoplasm of left kidney, except renal pelvis: Secondary | ICD-10-CM | POA: Diagnosis not present

## 2019-07-22 DIAGNOSIS — Z23 Encounter for immunization: Secondary | ICD-10-CM | POA: Diagnosis not present

## 2019-08-01 DIAGNOSIS — Z85828 Personal history of other malignant neoplasm of skin: Secondary | ICD-10-CM | POA: Diagnosis not present

## 2019-08-01 DIAGNOSIS — L570 Actinic keratosis: Secondary | ICD-10-CM | POA: Diagnosis not present

## 2019-08-01 DIAGNOSIS — L820 Inflamed seborrheic keratosis: Secondary | ICD-10-CM | POA: Diagnosis not present

## 2019-09-04 DIAGNOSIS — Z1389 Encounter for screening for other disorder: Secondary | ICD-10-CM | POA: Diagnosis not present

## 2019-09-04 DIAGNOSIS — E041 Nontoxic single thyroid nodule: Secondary | ICD-10-CM | POA: Diagnosis not present

## 2019-09-04 DIAGNOSIS — N183 Chronic kidney disease, stage 3 unspecified: Secondary | ICD-10-CM | POA: Diagnosis not present

## 2019-09-04 DIAGNOSIS — F39 Unspecified mood [affective] disorder: Secondary | ICD-10-CM | POA: Diagnosis not present

## 2019-09-04 DIAGNOSIS — C642 Malignant neoplasm of left kidney, except renal pelvis: Secondary | ICD-10-CM | POA: Diagnosis not present

## 2019-09-04 DIAGNOSIS — K219 Gastro-esophageal reflux disease without esophagitis: Secondary | ICD-10-CM | POA: Diagnosis not present

## 2019-09-04 DIAGNOSIS — Z85528 Personal history of other malignant neoplasm of kidney: Secondary | ICD-10-CM | POA: Diagnosis not present

## 2019-09-04 DIAGNOSIS — E559 Vitamin D deficiency, unspecified: Secondary | ICD-10-CM | POA: Diagnosis not present

## 2019-09-04 DIAGNOSIS — Z0000 Encounter for general adult medical examination without abnormal findings: Secondary | ICD-10-CM | POA: Diagnosis not present

## 2019-11-02 DIAGNOSIS — Z23 Encounter for immunization: Secondary | ICD-10-CM | POA: Diagnosis not present

## 2019-11-11 ENCOUNTER — Ambulatory Visit: Payer: Medicare Other | Attending: Internal Medicine

## 2019-12-01 DIAGNOSIS — Z23 Encounter for immunization: Secondary | ICD-10-CM | POA: Diagnosis not present

## 2019-12-31 DIAGNOSIS — Z1231 Encounter for screening mammogram for malignant neoplasm of breast: Secondary | ICD-10-CM | POA: Diagnosis not present

## 2020-01-03 DIAGNOSIS — D2262 Melanocytic nevi of left upper limb, including shoulder: Secondary | ICD-10-CM | POA: Diagnosis not present

## 2020-01-03 DIAGNOSIS — D225 Melanocytic nevi of trunk: Secondary | ICD-10-CM | POA: Diagnosis not present

## 2020-01-03 DIAGNOSIS — L718 Other rosacea: Secondary | ICD-10-CM | POA: Diagnosis not present

## 2020-01-03 DIAGNOSIS — L814 Other melanin hyperpigmentation: Secondary | ICD-10-CM | POA: Diagnosis not present

## 2020-01-03 DIAGNOSIS — L570 Actinic keratosis: Secondary | ICD-10-CM | POA: Diagnosis not present

## 2020-01-03 DIAGNOSIS — L821 Other seborrheic keratosis: Secondary | ICD-10-CM | POA: Diagnosis not present

## 2020-01-03 DIAGNOSIS — Z85828 Personal history of other malignant neoplasm of skin: Secondary | ICD-10-CM | POA: Diagnosis not present

## 2020-03-26 ENCOUNTER — Other Ambulatory Visit (HOSPITAL_COMMUNITY): Payer: Self-pay | Admitting: Urology

## 2020-03-26 ENCOUNTER — Other Ambulatory Visit: Payer: Self-pay | Admitting: Urology

## 2020-03-26 DIAGNOSIS — C642 Malignant neoplasm of left kidney, except renal pelvis: Secondary | ICD-10-CM

## 2020-03-27 ENCOUNTER — Other Ambulatory Visit: Payer: Self-pay

## 2020-03-27 DIAGNOSIS — C642 Malignant neoplasm of left kidney, except renal pelvis: Secondary | ICD-10-CM

## 2020-04-04 ENCOUNTER — Other Ambulatory Visit: Payer: Self-pay | Admitting: Urology

## 2020-04-04 DIAGNOSIS — C642 Malignant neoplasm of left kidney, except renal pelvis: Secondary | ICD-10-CM | POA: Diagnosis not present

## 2020-04-05 LAB — COMPLETE METABOLIC PANEL WITH GFR
AG Ratio: 2.3 (calc) (ref 1.0–2.5)
ALT: 18 U/L (ref 6–29)
AST: 20 U/L (ref 10–35)
Albumin: 4.4 g/dL (ref 3.6–5.1)
Alkaline phosphatase (APISO): 56 U/L (ref 37–153)
BUN: 15 mg/dL (ref 7–25)
CO2: 29 mmol/L (ref 20–32)
Calcium: 9.4 mg/dL (ref 8.6–10.4)
Chloride: 104 mmol/L (ref 98–110)
Creat: 0.99 mg/dL (ref 0.50–0.99)
GFR, Est African American: 67 mL/min/{1.73_m2} (ref >=60)
GFR, Est Non African American: 58 mL/min/{1.73_m2} — ABNORMAL LOW (ref >=60)
Globulin: 1.9 g/dL (calc) (ref 1.9–3.7)
Glucose, Bld: 99 mg/dL (ref 65–139)
Potassium: 4.4 mmol/L (ref 3.5–5.3)
Sodium: 140 mmol/L (ref 135–146)
Total Bilirubin: 0.6 mg/dL (ref 0.2–1.2)
Total Protein: 6.3 g/dL (ref 6.1–8.1)

## 2020-04-11 ENCOUNTER — Other Ambulatory Visit: Payer: Self-pay | Admitting: Urology

## 2020-04-15 ENCOUNTER — Other Ambulatory Visit: Payer: Self-pay

## 2020-04-15 ENCOUNTER — Ambulatory Visit (HOSPITAL_COMMUNITY)
Admission: RE | Admit: 2020-04-15 | Discharge: 2020-04-15 | Disposition: A | Discharge status: Home / Self Care | Payer: Medicare Other | Source: Ambulatory Visit | Attending: Urology | Admitting: Urology

## 2020-04-15 DIAGNOSIS — C642 Malignant neoplasm of left kidney, except renal pelvis: Secondary | ICD-10-CM | POA: Diagnosis not present

## 2020-04-15 DIAGNOSIS — K5730 Diverticulosis of large intestine without perforation or abscess without bleeding: Secondary | ICD-10-CM | POA: Diagnosis not present

## 2020-04-15 DIAGNOSIS — K8689 Other specified diseases of pancreas: Secondary | ICD-10-CM | POA: Diagnosis not present

## 2020-04-15 DIAGNOSIS — K862 Cyst of pancreas: Secondary | ICD-10-CM | POA: Diagnosis not present

## 2020-04-15 DIAGNOSIS — I700 Atherosclerosis of aorta: Secondary | ICD-10-CM | POA: Diagnosis not present

## 2020-04-15 MED ORDER — IOHEXOL 300 MG/ML  SOLN
100.0000 mL | Freq: Once | INTRAMUSCULAR | Status: AC | PRN
  Administered 2020-04-15: 100 mL via INTRAVENOUS

## 2020-04-28 DIAGNOSIS — R0981 Nasal congestion: Secondary | ICD-10-CM | POA: Diagnosis not present

## 2020-04-28 DIAGNOSIS — B349 Viral infection, unspecified: Secondary | ICD-10-CM | POA: Diagnosis not present

## 2020-04-29 DIAGNOSIS — Z03818 Encounter for observation for suspected exposure to other biological agents ruled out: Secondary | ICD-10-CM | POA: Diagnosis not present

## 2020-04-29 DIAGNOSIS — R0602 Shortness of breath: Secondary | ICD-10-CM | POA: Diagnosis not present

## 2020-04-29 DIAGNOSIS — R05 Cough: Secondary | ICD-10-CM | POA: Diagnosis not present

## 2020-04-29 DIAGNOSIS — B349 Viral infection, unspecified: Secondary | ICD-10-CM | POA: Diagnosis not present

## 2020-05-01 ENCOUNTER — Ambulatory Visit (INDEPENDENT_AMBULATORY_CARE_PROVIDER_SITE_OTHER): Payer: Medicare Other | Admitting: Urology

## 2020-05-01 ENCOUNTER — Encounter: Payer: Self-pay | Admitting: Urology

## 2020-05-01 ENCOUNTER — Other Ambulatory Visit: Payer: Self-pay

## 2020-05-01 VITALS — BP 131/86 | HR 91 | Temp 98.6°F | Ht 64.0 in | Wt 130.0 lb

## 2020-05-01 DIAGNOSIS — C642 Malignant neoplasm of left kidney, except renal pelvis: Secondary | ICD-10-CM | POA: Diagnosis not present

## 2020-05-01 LAB — URINALYSIS, ROUTINE W REFLEX MICROSCOPIC
Bilirubin, UA: NEGATIVE
Glucose, UA: NEGATIVE
Ketones, UA: NEGATIVE
Leukocytes,UA: NEGATIVE
Nitrite, UA: NEGATIVE
Protein,UA: NEGATIVE
RBC, UA: NEGATIVE
Specific Gravity, UA: <1.005 — ABNORMAL LOW (ref 1.005–1.030)
Urobilinogen, Ur: 0.2 mg/dL (ref 0.2–1.0)
pH, UA: 6 (ref 5.0–7.5)

## 2020-05-01 NOTE — Patient Instructions (Signed)
Renal Mass  A renal mass is a growth in the kidney. A renal mass may be found while performing an MRI, CT scan, or ultrasound for other problems of the abdomen. Certain types of cancers, infections, or injuries can cause a renal mass. A renal mass that is cancerous (malignant) may grow or spread quickly. Others are harmless (benign). What are common types of renal masses? Renal masses include:  Tumors. These may be cancerous (malignant) or noncancerous (benign). ? The most common type of kidney cancer is renal cell carcinoma. ? The most common benign tumors of the kidney include renal adenomas, oncocytomas, and angiomyolipoma (AML).  Cysts. These are fluid-filled sacs that form on or in the kidney. ? It is not always known what causes a cyst to develop in or on the kidney. ? Most kidney cysts do not cause symptoms and do not need to be treated. What type of testing might I need? Your health care provider may recommend that you have tests to diagnose the cause of your renal mass. The following tests may be done if a renal mass is found:  Physical exam.  Blood tests.  Urine tests.  Imaging tests, such as ultrasound, CT scan, or MRI.  Biopsy. This is a small sample that is removed from the renal mass and tested in a lab. The exact tests and how often they are done will depend on:  The size and appearance of the renal mass.  Risk factors or medical conditions that increase your risk for problems.  Any symptoms associated with the renal mass, or concerns that you have about it. Tests and physical exams may be done once, or they may be done regularly for a period of time. Tests and exams that are done regularly will help monitor whether the mass is growing and beginning to cause problems. What are common treatments for renal masses? Treatment is not always needed for this condition. Your health care provider may recommend careful monitoring (watchful waiting) and regular tests and exams.  Treatment will depend on the cause of the mass. Follow these instructions at home: What you need to do at home will depend on the cause of the mass. Follow the instructions that your health care provider gives to you. In general:  Take over-the-counter and prescription medicines only as told by your health care provider.  If you are prescribed an antibiotic medicine, take it as told by your health care provider. Do not stop taking the antibiotic even if you start to feel better.  Follow any restrictions that are given to you by your health care provider.  Keep all follow-up visits as told by your health care provider. This is important. ? You may need to see your health care provider once or twice a year to have CT scans and ultrasounds done. These tests will show if your renal mass has changed or grown bigger. Contact a health care provider if you:  Have pain in the side or back (flank pain).  Have a fever.  Feel full soon after eating.  Have pain or swelling in the abdomen.  Lose weight. Get help right away if:  Your pain gets worse.  There is blood in your urine.  You cannot urinate.  You have chest pain.  You have trouble breathing. Summary  A renal mass is a growth in the kidney. It may be cancerous (malignant) and grow or spread quickly, or it may be harmless (benign).  Renal masses may be found while performing   an MRI, CT scan, or ultrasound for other problems of the abdomen.  Your health care provider may recommend that you have tests to diagnose the cause of your renal mass. This may include a physical exam, blood tests, urine tests, imaging, or a biopsy.  Treatment is not always needed for this condition. Careful monitoring (watchful waiting) may be recommended. This information is not intended to replace advice given to you by your health care provider. Make sure you discuss any questions you have with your health care provider. Document Revised: 10/20/2017  Document Reviewed: 10/20/2017 Elsevier Patient Education  2020 [employer] Inc[employer]  

## 2020-05-01 NOTE — Progress Notes (Signed)
05/01/2020 10:15 AM   Darcey Nora [birth year] [id number]  Referring provider: Kelton Pillar, MD [address] [place],  [place] [address]  Followup left RCC  HPI: Ms Claire Humphrey is a [phone] here for followup for left RCC s/p left radical nephrectomy in 02/2015. Creatine 0.99. CT shows no evidence of metastatic disease. Pancreatic cyst stable and no further workup required.  She has a right renal cyst 64mm  Her records from [facility] are as follows: I have kidney cancer.  HPI: Claire Humphrey is a 69 year-old female established patient who is here for kidney cancer.  The problem is on the left side. She kidney cancer was diagnosed approximately 01/26/2015. The diagnosis was made by [facility]. Her cancer was diagnosed at [facility].  Energy good. No flank pain.   She is not having flank pain. She does have a good appetite. BOWEL HABITS: her bowels are moving normally. She is not having pain in new locations. She has not recently had unwanted weight loss.   Her last radiologic test to evaluate the kidneys was approximately 08/05/2016.   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 CT scan showed no evidence of metastatic disease. She has a subcentimeter pancreatic mass that is stable. recommended 1-2 year CT   02/22/2017: Creatinine is 1.5. CXR is negative for nodules. She has electric feeling in her lateral thigh.   09/01/2017: CT showed no evidence of metastatic disease. CMP creatinine 1.0. Pancreatic cyst stable in size.   03/07/2018: CMP normal, CXR no evidence of metastatic disease   09/25/2018: CMP normal. CT no evidence of metastatic disease. Pancreatic cyst is 71mm and stable. Creatinine is 0.8. No flank pain.   03/06/2019: CXR showed no evidence of metastatic disease. creatinine 0.8.  No flank pain.       PMH: Past Medical History:  Diagnosis Date  . Arthritis    hands   . Fibrocystic breast changes   . GERD (gastroesophageal reflux disease)    hx of   .  Goiter    multinodular thyroid   . History of renal cell carcinoma 2016   S/P post left nephrectomy  . Migraines   . PTSD (post-traumatic stress disorder)    hx of   . Rosacea   . Thyroid nodule, cold 2001   Transient hypothyroidism in 2001    Surgical History: Past Surgical History:  Procedure Laterality Date  . bilateral carpal tunnel surgery     . LAPAROSCOPIC NEPHRECTOMY Left 03/10/2015   Procedure: LAPAROSCOPIC LEFT NEPHRECTOMY;  Surgeon: Cleon Gustin, MD;  Location: [facility];  Service: Urology;  Laterality: Left;  . laparoscopic surgery      due to ovarian cysts- 1990   . left index finger titanium screw surgery     . Left ovarian cyst     Resection  . right middle finger cyst removal     . TUBAL LIGATION      Home Medications:  Allergies as of 05/01/2020      Reactions   Dilaudid  [hydromorphone Hcl] Other (See Comments)   Adhesive [tape] Other (See Comments)   redness   Ibuprofen Other (See Comments)   Nsaids    Latex Rash   Sulfur Rash      Medication List       Accurate as of May 01, 2020 10:15 AM. If you have any questions, ask your nurse or doctor.        aspirin 81 MG tablet Take 81  mg by mouth daily.   Psyllium Husk Powd Take 1 Scoop by mouth at bedtime.       Allergies:  Allergies  Allergen Reactions  . Dilaudid  [Hydromorphone Hcl] Other (See Comments)  . Adhesive [Tape] Other (See Comments)    redness  . Ibuprofen Other (See Comments)  . Nsaids   . Latex Rash  . Sulfur Rash    Family History: Family History  Problem Relation Age of Onset  . CAD Mother        History of CABG  . Diabetes Mother   . Hyperlipidemia Mother   . Transient ischemic attack Mother   . Breast cancer Mother   . Colon polyps Mother   . Heart failure Mother 92  . Leukemia Father   . Prostate cancer Father   . Renal cancer Sister   . Stroke Paternal Grandfather 29       Died at age 26  . Thyroid disease Sister        Younger sister thyroid Ca    . Arrhythmia Sister     Social History:  reports that she has never smoked. She has never used smokeless tobacco. She reports current alcohol use. She reports that she does not use drugs.  ROS: All other review of systems were reviewed and are negative except what is noted above in HPI  Physical Exam: There were no vitals taken for this visit.  Constitutional:  Alert and oriented, No acute distress. HEENT: [place] AT, moist mucus membranes.  Trachea midline, no masses. Cardiovascular: No clubbing, cyanosis, or edema. Respiratory: Normal respiratory effort, no increased work of breathing. GI: Abdomen is soft, nontender, nondistended, no abdominal masses GU: No CVA tenderness.  Lymph: No cervical or inguinal lymphadenopathy. Skin: No rashes, bruises or suspicious lesions. Neurologic: Grossly intact, no focal deficits, moving all 4 extremities. Psychiatric: Normal mood and affect.  Laboratory Data: Lab Results  Component Value Date   WBC 8.1 03/11/2015   HGB 12.2 03/11/2015   HCT 38.1 03/11/2015   MCV 90.3 03/11/2015   PLT 235 03/11/2015    Lab Results  Component Value Date   CREATININE 0.99 04/04/2020    No results found for: PSA  No results found for: TESTOSTERONE  No results found for: HGBA1C  Urinalysis No results found for: COLORURINE, APPEARANCEUR, LABSPEC, PHURINE, GLUCOSEU, HGBUR, BILIRUBINUR, KETONESUR, PROTEINUR, UROBILINOGEN, NITRITE, LEUKOCYTESUR  No results found for: LABMICR, [place], RBCUA, LABEPIT, MUCUS, BACTERIA  Pertinent Imaging: CT 04/15/2020: Images reviewed and discussed with the patient No results found for this or any previous visit.  No results found for this or any previous visit.  No results found for this or any previous visit.  No results found for this or any previous visit.  No results found for this or any previous visit.  No results found for this or any previous visit.  No results found for this or any previous visit.  No  results found for this or any previous visit.   Assessment & Plan:    1. Malignant neoplasm of left kidney, except renal pelvis (HCC) -We discussed the management of T1B RCC and the followup regiment. Since it is been 5 years since her surgery she can followup on a PRN basis. The patient wishes to continue to surveillance. I will see her back in 2 years with a CMP and renal US - Urinalysis, Routine w reflex microscopic   No follow-ups on file.  Nicolette Bang, MD  [facility] [place]

## 2020-05-01 NOTE — Progress Notes (Signed)
Urological Symptom Review  Patient is experiencing the following symptoms: Get up at night to urinate   Review of Systems  Gastrointestinal (upper)  : Negative for upper GI symptoms  Gastrointestinal (lower) : Negative for lower GI symptoms  Constitutional : Negative for symptoms  Skin: Negative for skin symptoms  Eyes: None   Ear/Nose/Throat : Sinus problems  Hematologic/Lymphatic: Negative for Hematologic/Lymphatic symptoms  Cardiovascular : Negative for cardiovascular symptoms  Respiratory : Negative for respiratory symptoms  Endocrine: Negative for endocrine symptoms  Musculoskeletal: Back pain Joint pain  Neurological: Negative for neurological symptoms  Psychologic: Negative for psychiatric symptoms

## 2020-07-02 DIAGNOSIS — D485 Neoplasm of uncertain behavior of skin: Secondary | ICD-10-CM | POA: Diagnosis not present

## 2020-07-02 DIAGNOSIS — L570 Actinic keratosis: Secondary | ICD-10-CM | POA: Diagnosis not present

## 2020-07-02 DIAGNOSIS — L821 Other seborrheic keratosis: Secondary | ICD-10-CM | POA: Diagnosis not present

## 2020-07-02 DIAGNOSIS — Z85828 Personal history of other malignant neoplasm of skin: Secondary | ICD-10-CM | POA: Diagnosis not present

## 2020-07-11 DIAGNOSIS — Z23 Encounter for immunization: Secondary | ICD-10-CM | POA: Diagnosis not present

## 2020-08-01 DIAGNOSIS — Z20828 Contact with and (suspected) exposure to other viral communicable diseases: Secondary | ICD-10-CM | POA: Diagnosis not present

## 2020-09-17 DIAGNOSIS — R030 Elevated blood-pressure reading, without diagnosis of hypertension: Secondary | ICD-10-CM | POA: Diagnosis not present

## 2020-09-17 DIAGNOSIS — I700 Atherosclerosis of aorta: Secondary | ICD-10-CM | POA: Diagnosis not present

## 2020-09-17 DIAGNOSIS — Z8371 Family history of colonic polyps: Secondary | ICD-10-CM | POA: Diagnosis not present

## 2020-09-17 DIAGNOSIS — Z0000 Encounter for general adult medical examination without abnormal findings: Secondary | ICD-10-CM | POA: Diagnosis not present

## 2020-09-17 DIAGNOSIS — Z85528 Personal history of other malignant neoplasm of kidney: Secondary | ICD-10-CM | POA: Diagnosis not present

## 2020-09-17 DIAGNOSIS — Z1389 Encounter for screening for other disorder: Secondary | ICD-10-CM | POA: Diagnosis not present

## 2020-09-17 DIAGNOSIS — E041 Nontoxic single thyroid nodule: Secondary | ICD-10-CM | POA: Diagnosis not present

## 2020-09-17 DIAGNOSIS — S41112A Laceration without foreign body of left upper arm, initial encounter: Secondary | ICD-10-CM | POA: Diagnosis not present

## 2020-09-17 DIAGNOSIS — E559 Vitamin D deficiency, unspecified: Secondary | ICD-10-CM | POA: Diagnosis not present

## 2020-10-28 ENCOUNTER — Other Ambulatory Visit: Payer: Self-pay

## 2020-10-29 ENCOUNTER — Telehealth: Payer: Self-pay

## 2020-10-29 NOTE — Telephone Encounter (Signed)
Pt had lab work faxed here for Dr. Alyson Ingles to look at. She was especially concerned with the GFR. Dr Alyson Ingles looked at all and said they were fine. Called pt and notified her.

## 2020-10-30 ENCOUNTER — Other Ambulatory Visit: Payer: Self-pay

## 2020-12-29 DIAGNOSIS — L814 Other melanin hyperpigmentation: Secondary | ICD-10-CM | POA: Diagnosis not present

## 2020-12-29 DIAGNOSIS — Z85828 Personal history of other malignant neoplasm of skin: Secondary | ICD-10-CM | POA: Diagnosis not present

## 2020-12-29 DIAGNOSIS — L570 Actinic keratosis: Secondary | ICD-10-CM | POA: Diagnosis not present

## 2020-12-29 DIAGNOSIS — L718 Other rosacea: Secondary | ICD-10-CM | POA: Diagnosis not present

## 2020-12-29 DIAGNOSIS — D225 Melanocytic nevi of trunk: Secondary | ICD-10-CM | POA: Diagnosis not present

## 2020-12-29 DIAGNOSIS — L821 Other seborrheic keratosis: Secondary | ICD-10-CM | POA: Diagnosis not present

## 2020-12-31 DIAGNOSIS — Z1231 Encounter for screening mammogram for malignant neoplasm of breast: Secondary | ICD-10-CM | POA: Diagnosis not present

## 2021-07-17 DIAGNOSIS — Z23 Encounter for immunization: Secondary | ICD-10-CM | POA: Diagnosis not present

## 2021-08-26 DIAGNOSIS — L820 Inflamed seborrheic keratosis: Secondary | ICD-10-CM | POA: Diagnosis not present

## 2021-08-26 DIAGNOSIS — L814 Other melanin hyperpigmentation: Secondary | ICD-10-CM | POA: Diagnosis not present

## 2021-08-26 DIAGNOSIS — L570 Actinic keratosis: Secondary | ICD-10-CM | POA: Diagnosis not present

## 2021-08-26 DIAGNOSIS — Z85828 Personal history of other malignant neoplasm of skin: Secondary | ICD-10-CM | POA: Diagnosis not present

## 2021-09-18 DIAGNOSIS — M8588 Other specified disorders of bone density and structure, other site: Secondary | ICD-10-CM | POA: Diagnosis not present

## 2021-09-18 DIAGNOSIS — Z85528 Personal history of other malignant neoplasm of kidney: Secondary | ICD-10-CM | POA: Diagnosis not present

## 2021-09-18 DIAGNOSIS — E041 Nontoxic single thyroid nodule: Secondary | ICD-10-CM | POA: Diagnosis not present

## 2021-09-18 DIAGNOSIS — Z0000 Encounter for general adult medical examination without abnormal findings: Secondary | ICD-10-CM | POA: Diagnosis not present

## 2021-09-18 DIAGNOSIS — Z8371 Family history of colonic polyps: Secondary | ICD-10-CM | POA: Diagnosis not present

## 2021-09-18 DIAGNOSIS — R030 Elevated blood-pressure reading, without diagnosis of hypertension: Secondary | ICD-10-CM | POA: Diagnosis not present

## 2021-09-18 DIAGNOSIS — Z1389 Encounter for screening for other disorder: Secondary | ICD-10-CM | POA: Diagnosis not present

## 2021-09-18 DIAGNOSIS — N183 Chronic kidney disease, stage 3 unspecified: Secondary | ICD-10-CM | POA: Diagnosis not present

## 2021-09-18 DIAGNOSIS — E559 Vitamin D deficiency, unspecified: Secondary | ICD-10-CM | POA: Diagnosis not present

## 2021-09-18 DIAGNOSIS — I700 Atherosclerosis of aorta: Secondary | ICD-10-CM | POA: Diagnosis not present

## 2021-12-17 IMAGING — CT CT ABD-PEL WO/W CM
2 of 13 series · 10 of 46 positions shown, 16 images · IV contrast (Omnipaque or Isovue)
Comparison: Multiple exams, including 09/13/2018

CLINICAL DATA: Left nephrectomy for renal cell carcinoma, restaging
workup.

EXAM:
CT ABDOMEN AND PELVIS WITHOUT AND WITH CONTRAST
TECHNIQUE: Multidetector CT imaging of the abdomen and pelvis was performed
following the standard protocol before and following the bolus
administration of intravenous contrast.
CONTRAST:  100mL OMNIPAQUE IOHEXOL 300 MG/ML  SOLN

[Series 4: axial nephro · axial · 0.69mm/px · z∈[+960,+1296]mm · 8 of 146 slices shown, 13 images]
[im 17/146  soft-tissue]
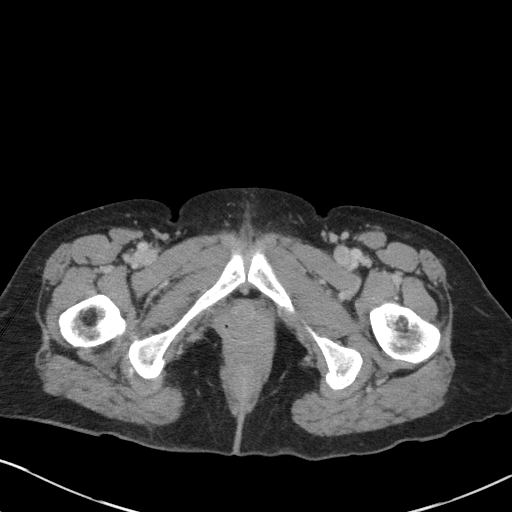
[im 17/146  bone]
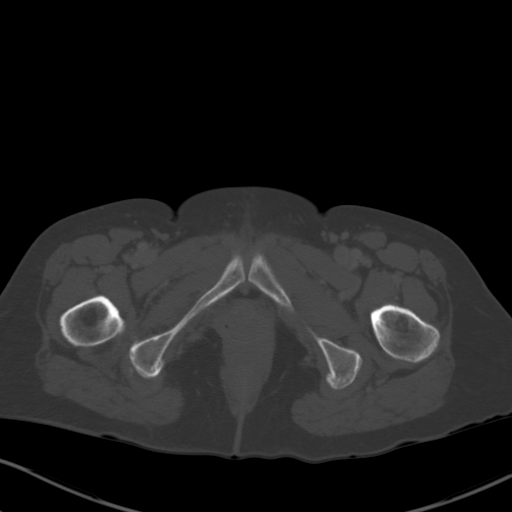
[im 33/146  soft-tissue]
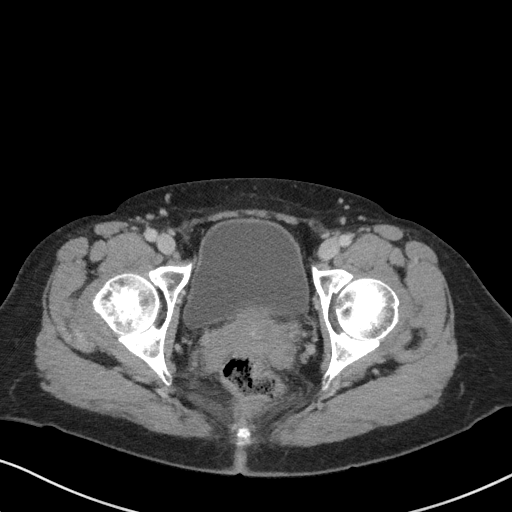
[im 49/146  soft-tissue]
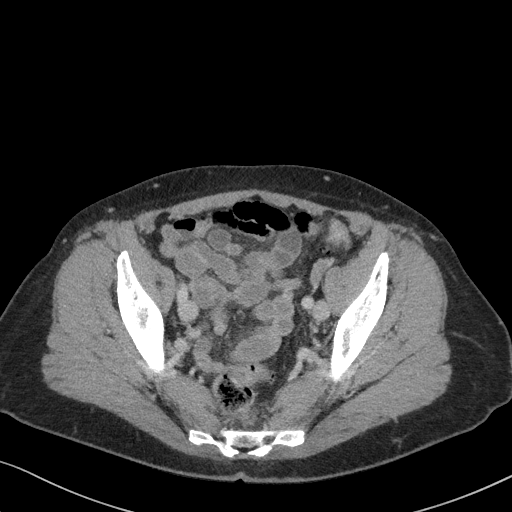
[im 65/146  soft-tissue]
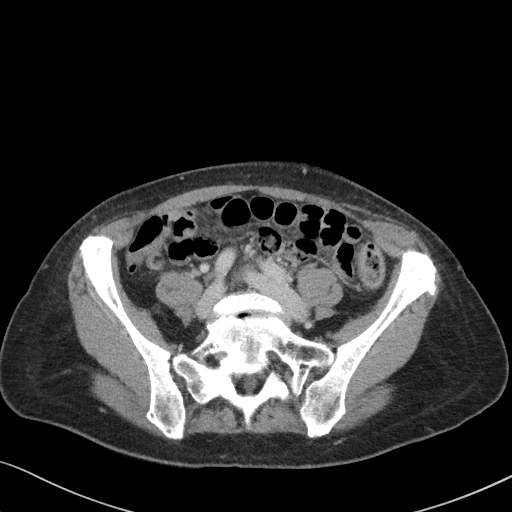
[im 81/146  soft-tissue]
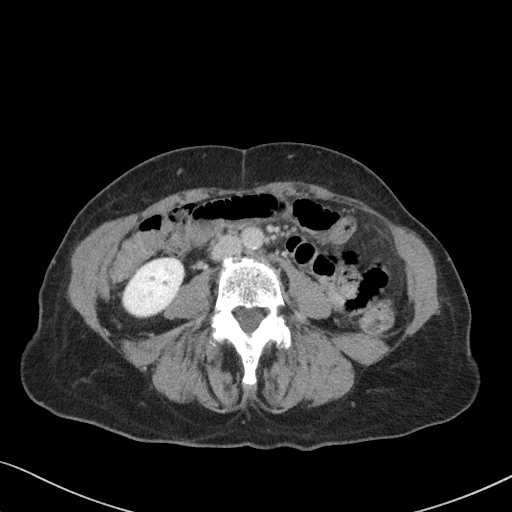
[im 81/146  lung]
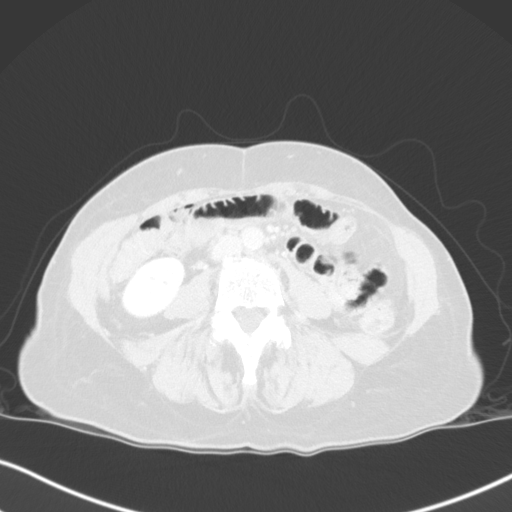
[im 97/146  soft-tissue]
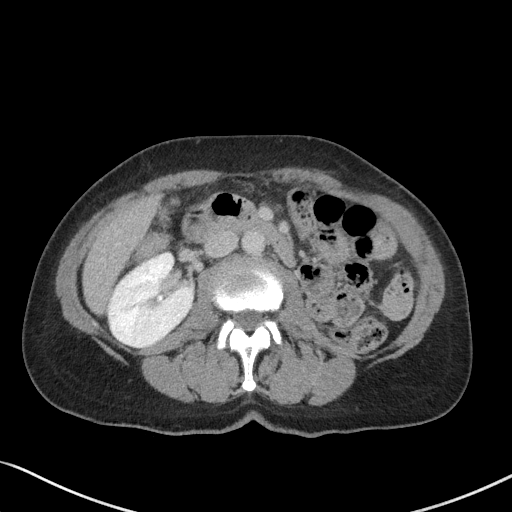
[im 97/146  lung]
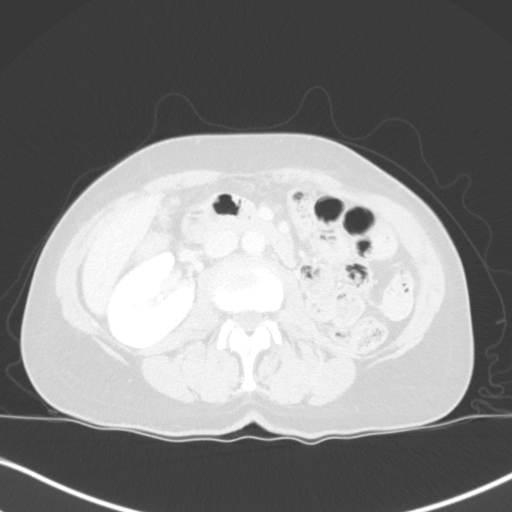
[im 113/146  soft-tissue]
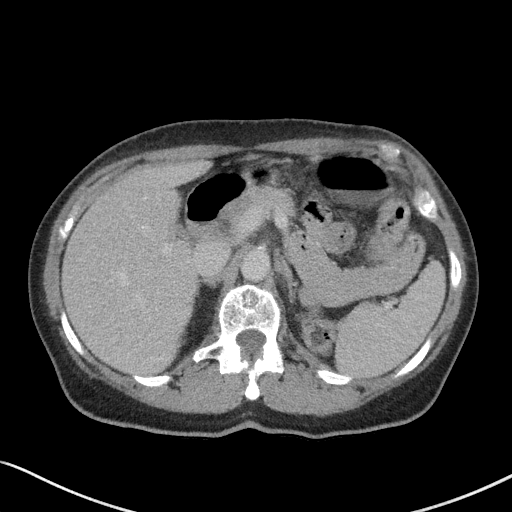
[im 113/146  lung]
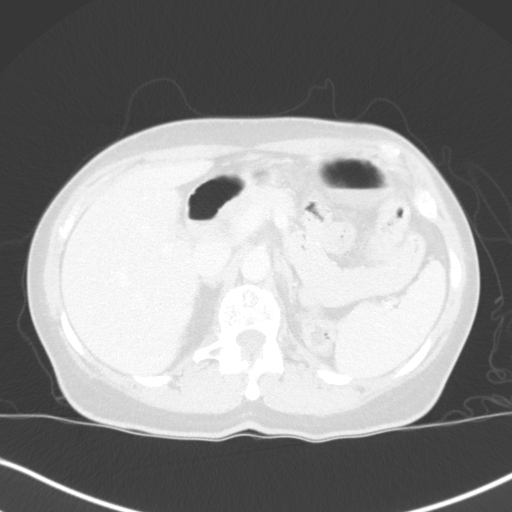
[im 129/146  soft-tissue]
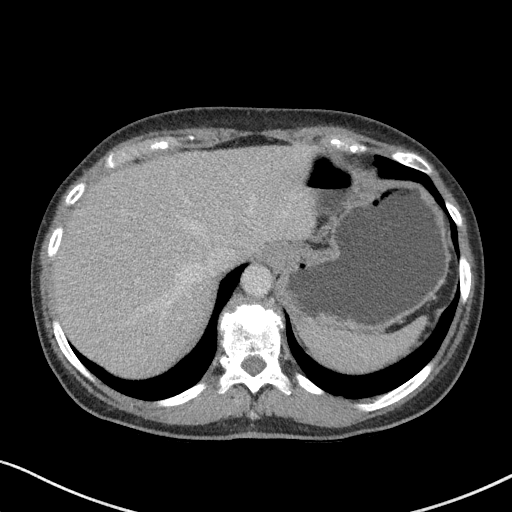
[im 129/146  lung]
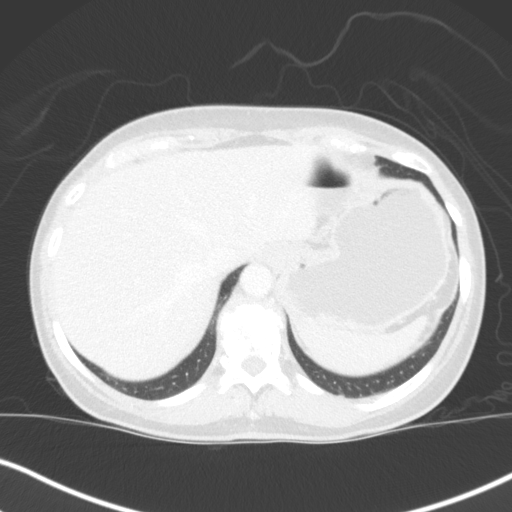

[Series 7: coronal pre · coronal · non-contrast · 0.44mm/px · 2 of 101 slices shown, 3 images]
[im 34/101  soft-tissue]
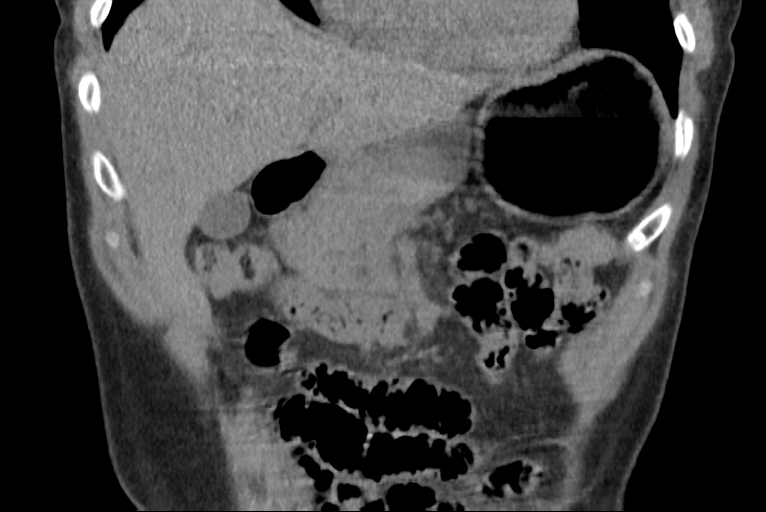
[im 34/101  bone]
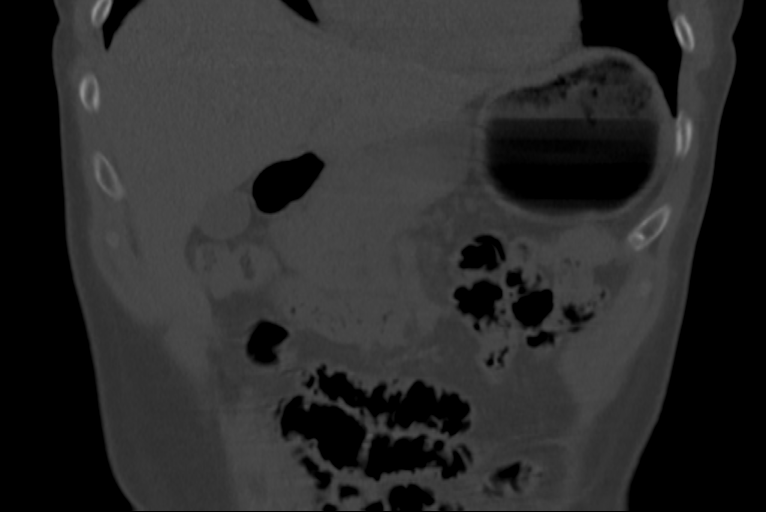
[im 67/101  soft-tissue]
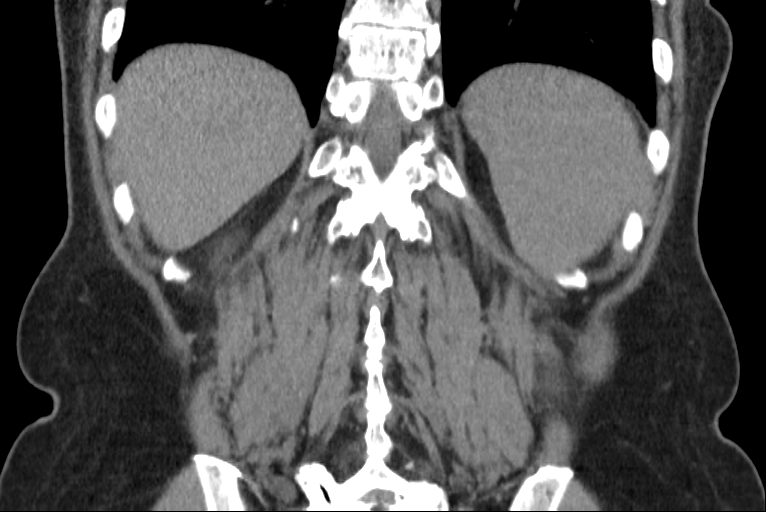

[10 of 46 positions shown; findings below may reference images not displayed]

FINDINGS: Lower chest: Unremarkable

Hepatobiliary: The liver and gallbladder appear unremarkable. Common
bile duct 0.6 cm in diameter, within normal limits.

Pancreas: 0.5 cm cystic lesion along the tail the pancreas common no
change from 02/10/2015, the stability over the last greater than 5
years favors a benign postinflammatory etiology and further workup
of this lesion is not indicated. The pancreas appears otherwise
normal.

Spleen: Unremarkable

Adrenals/Urinary Tract: Both adrenal glands appear normal. Left
nephrectomy without local recurrence. 0.6 by 0.4 cm right kidney
lower pole hypodense lesion on image 63/4, technically nonspecific
due to small size but likely a benign cyst.

No definite urinary tract calculi. No hydronephrosis or hydroureter.
Urinary bladder unremarkable.

Stomach/Bowel: Sigmoid colon diverticulosis.

Vascular/Lymphatic: Aortoiliac atherosclerotic vascular disease. No
pathologic adenopathy.

Reproductive: Unremarkable

Other: No supplemental non-categorized findings.

Musculoskeletal: Lumbar spondylosis and degenerative disc disease.
There is some chronic thinning of the left lateral rectus abdominus
muscle.
IMPRESSION: 1. No findings of recurrent malignancy.
2. Other imaging findings of potential clinical significance:
Sigmoid colon diverticulosis. Lumbar spondylosis and degenerative
disc disease.
3. Chronically stable 5 mm cystic lesion in the tail the pancreas
common no change from 9797, considered benign, no further workup
indicated.
4. Aortic atherosclerosis.

Aortic Atherosclerosis ([id number]).

## 2021-12-29 DIAGNOSIS — D2262 Melanocytic nevi of left upper limb, including shoulder: Secondary | ICD-10-CM | POA: Diagnosis not present

## 2021-12-29 DIAGNOSIS — D1801 Hemangioma of skin and subcutaneous tissue: Secondary | ICD-10-CM | POA: Diagnosis not present

## 2021-12-29 DIAGNOSIS — L814 Other melanin hyperpigmentation: Secondary | ICD-10-CM | POA: Diagnosis not present

## 2021-12-29 DIAGNOSIS — Z85828 Personal history of other malignant neoplasm of skin: Secondary | ICD-10-CM | POA: Diagnosis not present

## 2021-12-29 DIAGNOSIS — L718 Other rosacea: Secondary | ICD-10-CM | POA: Diagnosis not present

## 2021-12-29 DIAGNOSIS — L570 Actinic keratosis: Secondary | ICD-10-CM | POA: Diagnosis not present

## 2021-12-29 DIAGNOSIS — L578 Other skin changes due to chronic exposure to nonionizing radiation: Secondary | ICD-10-CM | POA: Diagnosis not present

## 2021-12-29 DIAGNOSIS — D2221 Melanocytic nevi of right ear and external auricular canal: Secondary | ICD-10-CM | POA: Diagnosis not present

## 2021-12-29 DIAGNOSIS — L821 Other seborrheic keratosis: Secondary | ICD-10-CM | POA: Diagnosis not present

## 2021-12-29 DIAGNOSIS — D225 Melanocytic nevi of trunk: Secondary | ICD-10-CM | POA: Diagnosis not present

## 2021-12-29 DIAGNOSIS — L601 Onycholysis: Secondary | ICD-10-CM | POA: Diagnosis not present

## 2022-01-06 DIAGNOSIS — M85852 Other specified disorders of bone density and structure, left thigh: Secondary | ICD-10-CM | POA: Diagnosis not present

## 2022-01-06 DIAGNOSIS — Z1231 Encounter for screening mammogram for malignant neoplasm of breast: Secondary | ICD-10-CM | POA: Diagnosis not present

## 2022-01-06 DIAGNOSIS — Z780 Asymptomatic menopausal state: Secondary | ICD-10-CM | POA: Diagnosis not present

## 2022-03-03 ENCOUNTER — Telehealth: Payer: Self-pay

## 2022-03-03 NOTE — Telephone Encounter (Signed)
Patient wanting to ask Dr. Alyson Ingles if he thinks it will be ok for her to do the prep for a colonoscopy (04-09-22). She had an issues a few years back and wanted to double check.  Please advise.  Call back:  [phone]  Thanks, Claire Humphrey

## 2022-03-03 NOTE — Telephone Encounter (Signed)
Any reason she shouldn't?

## 2022-03-04 NOTE — Telephone Encounter (Signed)
Patient called and notified to proceed.

## 2022-04-09 DIAGNOSIS — Z8371 Family history of colonic polyps: Secondary | ICD-10-CM | POA: Diagnosis not present

## 2022-04-09 DIAGNOSIS — K5730 Diverticulosis of large intestine without perforation or abscess without bleeding: Secondary | ICD-10-CM | POA: Diagnosis not present

## 2022-04-09 DIAGNOSIS — Z1211 Encounter for screening for malignant neoplasm of colon: Secondary | ICD-10-CM | POA: Diagnosis not present

## 2022-04-09 DIAGNOSIS — K648 Other hemorrhoids: Secondary | ICD-10-CM | POA: Diagnosis not present

## 2022-04-23 ENCOUNTER — Ambulatory Visit (INDEPENDENT_AMBULATORY_CARE_PROVIDER_SITE_OTHER): Payer: Medicare Other | Admitting: Urology

## 2022-04-23 VITALS — BP 145/90 | HR 89 | Ht 64.0 in | Wt 130.0 lb

## 2022-04-23 DIAGNOSIS — Z85528 Personal history of other malignant neoplasm of kidney: Secondary | ICD-10-CM | POA: Diagnosis not present

## 2022-04-23 DIAGNOSIS — C642 Malignant neoplasm of left kidney, except renal pelvis: Secondary | ICD-10-CM

## 2022-04-23 LAB — URINALYSIS, ROUTINE W REFLEX MICROSCOPIC
Bilirubin, UA: NEGATIVE
Glucose, UA: NEGATIVE
Ketones, UA: NEGATIVE
Leukocytes,UA: NEGATIVE
Nitrite, UA: NEGATIVE
Protein,UA: NEGATIVE
RBC, UA: NEGATIVE
Specific Gravity, UA: <1.005 — ABNORMAL LOW (ref 1.005–1.030)
Urobilinogen, Ur: 0.2 mg/dL (ref 0.2–1.0)
pH, UA: 5 (ref 5.0–7.5)

## 2022-04-23 NOTE — Progress Notes (Signed)
04/23/2022 9:15 AM   Claire Humphrey 12-13-1950 [id number]  Referring provider: Kelton Pillar, MD [address] [place],  [place] [address]  Followup left RCC   HPI: Claire Humphrey is a 72yo here for followup for Left RCC removed in 2016. Creatinine 0.98. No flank pain. She drinks over 60oz of water daily. No significant LUTS.    PMH: Past Medical History:  Diagnosis Date   Arthritis    hands    Fibrocystic breast changes    GERD (gastroesophageal reflux disease)    hx of    Goiter    multinodular thyroid    History of renal cell carcinoma 2016   S/P post left nephrectomy   Migraines    PTSD (post-traumatic stress disorder)    hx of    Rosacea    Thyroid nodule, cold 2001   Transient hypothyroidism in 2001    Surgical History: Past Surgical History:  Procedure Laterality Date   bilateral carpal tunnel surgery      LAPAROSCOPIC NEPHRECTOMY Left 03/10/2015   Procedure: LAPAROSCOPIC LEFT NEPHRECTOMY;  Surgeon: Cleon Gustin, MD;  Location: [facility];  Service: Urology;  Laterality: Left;   laparoscopic surgery      due to ovarian cysts- 1990    left index finger titanium screw surgery      Left ovarian cyst     Resection   right middle finger cyst removal      TUBAL LIGATION      Home Medications:  Allergies as of 04/23/2022       Reactions   Dilaudid  [hydromorphone Hcl] Other (See Comments)   Adhesive [tape] Other (See Comments)   redness   Ibuprofen Other (See Comments)   Nsaids    Elemental Sulfur Rash   Latex Rash   Sulfa Antibiotics Rash        Medication List        Accurate as of April 23, 2022  9:15 AM. If you have any questions, ask your nurse or doctor.          STOP taking these medications    amoxicillin 500 MG tablet Commonly known as: AMOXIL   aspirin 81 MG tablet   EQL Vitamin D3 50 MCG (2000 UT) Caps Generic drug: Cholecalciferol   KRILL OIL PO   multivitamin with minerals tablet   Vitamin D3 50 MCG (2000  UT) Chew       TAKE these medications    Azelaic Acid 15 % gel Apply topically daily.   metroNIDAZOLE 0.75 % cream Commonly known as: METROCREAM Apply topically 2 (two) times daily.   Psyllium Husk Powd Take 1 Scoop by mouth at bedtime.        Allergies:  Allergies  Allergen Reactions   Dilaudid  [Hydromorphone Hcl] Other (See Comments)   Adhesive [Tape] Other (See Comments)    redness   Ibuprofen Other (See Comments)   Nsaids    Elemental Sulfur Rash   Latex Rash   Sulfa Antibiotics Rash    Family History: Family History  Problem Relation Age of Onset   CAD Mother        History of CABG   Diabetes Mother    Hyperlipidemia Mother    Transient ischemic attack Mother    Breast cancer Mother    Colon polyps Mother    Heart failure Mother 83   Leukemia Father    Prostate cancer Father    Renal cancer Sister    Stroke  Paternal Grandfather 13       Died at age 42   Thyroid disease Sister        Younger sister thyroid Ca   Arrhythmia Sister     Social History:  reports that she has never smoked. She has never used smokeless tobacco. She reports current alcohol use. She reports that she does not use drugs.  ROS: All other review of systems were reviewed and are negative except what is noted above in HPI  Physical Exam: BP (!) 145/90   Pulse 89   Ht '5\' 4"'$  (1.626 m)   Wt 130 lb (59 kg)   BMI 22.31 kg/m   Constitutional:  Alert and oriented, No acute distress. HEENT: [place] AT, moist mucus membranes.  Trachea midline, no masses. Cardiovascular: No clubbing, cyanosis, or edema. Respiratory: Normal respiratory effort, no increased work of breathing. GI: Abdomen is soft, nontender, nondistended, no abdominal masses GU: No CVA tenderness.  Lymph: No cervical or inguinal lymphadenopathy. Skin: No rashes, bruises or suspicious lesions. Neurologic: Grossly intact, no focal deficits, moving all 4 extremities. Psychiatric: Normal mood and affect.  Laboratory  Data: Lab Results  Component Value Date   WBC 8.1 03/11/2015   HGB 12.2 03/11/2015   HCT 38.1 03/11/2015   MCV 90.3 03/11/2015   PLT 235 03/11/2015    Lab Results  Component Value Date   CREATININE 0.99 04/04/2020    No results found for: "PSA"  No results found for: "TESTOSTERONE"  No results found for: "HGBA1C"  Urinalysis    Component Value Date/Time   APPEARANCEUR Clear 05/01/2020 1013   GLUCOSEU Negative 05/01/2020 1013   BILIRUBINUR Negative 05/01/2020 1013   PROTEINUR Negative 05/01/2020 1013   NITRITE Negative 05/01/2020 1013   LEUKOCYTESUR Negative 05/01/2020 1013    Lab Results  Component Value Date   LABMICR Comment 05/01/2020    Pertinent Imaging:  No results found for this or any previous visit.  No results found for this or any previous visit.  No results found for this or any previous visit.  No results found for this or any previous visit.  No results found for this or any previous visit.  No results found for this or any previous visit.  No results found for this or any previous visit.  No results found for this or any previous visit.   Assessment & Plan:    1. Malignant neoplasm of left kidney, except renal pelvis (HCC) - - Urinalysis, Routine w reflex microscopic   No follow-ups on file.  Nicolette Bang, MD  [facility]  [facility]

## 2022-05-02 ENCOUNTER — Encounter: Payer: Self-pay | Admitting: Urology

## 2022-05-02 NOTE — Patient Instructions (Signed)
Kidney Cancer  Kidney cancer is an abnormal growth of cells in one or both kidneys. The kidneys filter waste from your blood and produce urine. Kidney cancer may spread to other parts of your body. This type of cancer may also be called renal cell carcinoma. What are the causes? The cause of this condition is not always known. In some cases, abnormal changes to genes (genetic mutations) can cause cells to form cancer. What increases the risk? You may be more likely to develop kidney cancer if you: Are over age 52. The risk increases with age. Have a family history of kidney cancer. Are of African-American, Native American, or Native [place] descent. Smoke. Are female. Are obese. Have high blood pressure (hypertension). Have advanced kidney disease, especially if you need long-term dialysis. Have certain conditions that are passed from parent to child (inherited), such as von Hippel-Lindau disease, tuberous sclerosis, or hereditary papillary renal carcinoma. Have been exposed to certain chemicals. What are the signs or symptoms? In the early stages, kidney cancer does not cause symptoms. As the cancer grows, symptoms may include: Blood in the urine. Pain in the upper back or abdomen, just below the rib cage. You may feel pain on one or both sides of the body. Fatigue. Unexplained weight loss. Fever. How is this diagnosed? This condition may be diagnosed based on: Your symptoms and medical history. A physical exam. Blood and urine tests. X-rays. Imaging tests, such as CT scans, MRIs, and PET scans. Having dye injected into your blood through an IV, and then having X-rays taken of: Your kidneys and the rest of the organs involved in making and storing urine (intravenous pyelogram). Your blood vessels (angiogram). Removal and testing of a kidney tissue sample (biopsy). Your cancer will be assessed (staged), based on how severe it is and how much it has spread. How is this  treated? Treatment depends on the type and stage of the cancer. Treatment may include one or more of the following: Surgery. This may include surgery to remove: Just the tumor (nephron-sparing surgery). The entire kidney (nephrectomy). The kidney, some of the surrounding healthy tissue, nearby lymph nodes, and the adrenal gland in certain cases (radical nephrectomy). Medicines that kill cancer cells (chemotherapy). High-energy rays that kill cancer cells (radiation therapy). Targeted therapy. This targets specific parts of cancer cells and the area around them to block the growth and the spread of the cancer. Targeted therapy can help to limit the damage to healthy cells. Medicines that help your body's disease-fighting system (immune system) fight cancer cells (immunotherapy). Freezing cancer cells using gas or liquid that is delivered through a needle (cryoablation). Destroying cancer cells using high-energy radio waves that are delivered through a needle-like probe (radiofrequency ablation). A procedure to block the artery that supplies blood to the tumor, which kills the cancer cells (embolization). Follow these instructions at home: Eating and drinking Some of your treatments might affect your appetite and your ability to chew and swallow. If you are having problems eating, or if you do not have an appetite, meet with a diet and nutrition specialist (dietitian). If you have side effects that affect eating, it may help to: Eat smaller meals and snacks often. Drink high-nutrition and high-calorie shakes or supplements. Eat bland and soft foods that are easy to eat. Not eat foods that are hot, spicy, or hard to swallow. Lifestyle Do not drink alcohol. Do not use any products that contain nicotine or tobacco, such as cigarettes and e-cigarettes. If you need  help quitting, ask your health care provider. General instructions  Take over-the-counter and prescription medicines only as told by  your health care provider. This includes vitamins, supplements, and herbal products. Consider joining a support group to help you cope with the stress of having kidney cancer. Work with your health care provider to manage any side effects of treatment. Keep all follow-up visits as told by your health care provider. This is important. Where to find more information American Cancer Society: https://www.cancer.[place] ([place]): https://www.cancer.gov Contact a health care provider if you: Notice that you bruise or bleed easily. Are losing weight without trying. Have new or increased fatigue or weakness. Get help right away if you have: Blood in your urine. A sudden increase in pain. A fever. Shortness of breath. Chest pain. Yellow skin or whites of your eyes (jaundice). Summary Kidney cancer is an abnormal growth of cells (tumor) in one or both kidneys. Tumors may spread to other parts of your body. In the early stages, kidney cancer does not cause symptoms. As the cancer grows, symptoms may include blood in the urine, pain in the upper back or abdomen, unexplained weight loss, fatigue, and fever. Treatment depends on the type and stage of the cancer. It may include surgery to remove the tumor, procedures and medicines to kill the cancer cells, or medicines to help your body fight cancer cells. This information is not intended to replace advice given to you by your health care provider. Make sure you discuss any questions you have with your health care provider. Document Revised: 12/01/2021 Document Reviewed: 05/05/2021 Elsevier Patient Education  [place].

## 2022-06-10 DIAGNOSIS — Z85828 Personal history of other malignant neoplasm of skin: Secondary | ICD-10-CM | POA: Diagnosis not present

## 2022-06-10 DIAGNOSIS — L570 Actinic keratosis: Secondary | ICD-10-CM | POA: Diagnosis not present

## 2022-06-16 ENCOUNTER — Telehealth: Payer: Self-pay

## 2022-06-16 NOTE — Telephone Encounter (Signed)
Patient called the office to discuss with Dr. Alyson Ingles diagnosis given to patient by another provider/practice of chronic kidney disease.  Per patient she has never had these issues before and it has caused her insurance premiums to increase. Patient reports she has never been told she has kidney disease.  Patient has requested for Dr. Alyson Ingles to review lab work and write statement to insurance company based off his medical opinion and patient blood work.

## 2022-07-29 NOTE — Telephone Encounter (Signed)
Patient called back this week to inquire status of letter.

## 2022-08-06 ENCOUNTER — Telehealth: Payer: Self-pay | Admitting: Urology

## 2022-08-06 DIAGNOSIS — Z23 Encounter for immunization: Secondary | ICD-10-CM | POA: Diagnosis not present

## 2022-08-06 NOTE — Telephone Encounter (Signed)
Dr. Alyson Ingles created letter per patient request for insurance.  Patient advised of letter- request mailing of letter. Patient will also call back and leave fax number for attorney office to fax letter to as well.

## 2022-08-06 NOTE — Telephone Encounter (Signed)
NA

## 2022-08-09 NOTE — Telephone Encounter (Signed)
Letter faxed to [phone] provided by patient.

## 2022-08-09 NOTE — Telephone Encounter (Signed)
Letter faxed to [phone] provided by patient.

## 2022-08-11 ENCOUNTER — Telehealth: Payer: Self-pay

## 2022-08-11 NOTE — Telephone Encounter (Signed)
Patient called and advised she wanted to follow up to see if [facility] was able to contact patients insurance to speak with a provider there to discuss the documents turned in by patient.

## 2022-08-25 NOTE — Telephone Encounter (Signed)
Patient called in concern about having a UTI and she feels pressure when voiding. Offer patient the option to do a urine drop off.  Patient voiced that she can come do a UA drop off tomorrow morning. Patient voiced understanding

## 2022-08-26 ENCOUNTER — Other Ambulatory Visit: Payer: Medicare Other

## 2022-08-26 ENCOUNTER — Telehealth: Payer: Self-pay

## 2022-08-26 DIAGNOSIS — R399 Unspecified symptoms and signs involving the genitourinary system: Secondary | ICD-10-CM | POA: Diagnosis not present

## 2022-08-26 DIAGNOSIS — C642 Malignant neoplasm of left kidney, except renal pelvis: Secondary | ICD-10-CM

## 2022-08-26 LAB — URINALYSIS, ROUTINE W REFLEX MICROSCOPIC
Bilirubin, UA: NEGATIVE
Glucose, UA: NEGATIVE
Ketones, UA: NEGATIVE
Leukocytes,UA: NEGATIVE
Nitrite, UA: NEGATIVE
Protein,UA: NEGATIVE
RBC, UA: NEGATIVE
Specific Gravity, UA: 1.005 (ref 1.005–1.030)
Urobilinogen, Ur: 0.2 mg/dL (ref 0.2–1.0)
pH, UA: 5.5 (ref 5.0–7.5)

## 2022-08-26 NOTE — Telephone Encounter (Signed)
Pt called to f/u on her urine drop off from this morning.  Verbal from [employer] urine is clear, no treatment needed at this time.  Returned pt's call with no answer, left voicemail requesting a call back for results.

## 2022-08-27 NOTE — Telephone Encounter (Signed)
Return pt's call for f/u on urine drop off. Made pt aware that her urine was clear and no treatment is need at this time. Made patient aware that a urine culture was sent out and once we received the results back and MD has review, someone will be in touch. Made patient aware that she can try AZO for urinary symptoms and increase fluid intake. Patient voiced understanding

## 2022-08-28 LAB — URINE CULTURE

## 2022-08-30 NOTE — Progress Notes (Signed)
Letter sent.

## 2022-10-27 DIAGNOSIS — Z905 Acquired absence of kidney: Secondary | ICD-10-CM | POA: Diagnosis not present

## 2022-10-27 DIAGNOSIS — M545 Low back pain, unspecified: Secondary | ICD-10-CM | POA: Diagnosis not present

## 2022-10-27 DIAGNOSIS — Z85528 Personal history of other malignant neoplasm of kidney: Secondary | ICD-10-CM | POA: Diagnosis not present

## 2022-11-03 ENCOUNTER — Other Ambulatory Visit: Payer: Self-pay | Admitting: Family Medicine

## 2022-11-03 DIAGNOSIS — M545 Low back pain, unspecified: Secondary | ICD-10-CM | POA: Diagnosis not present

## 2022-11-03 DIAGNOSIS — Z85528 Personal history of other malignant neoplasm of kidney: Secondary | ICD-10-CM

## 2022-11-03 DIAGNOSIS — R109 Unspecified abdominal pain: Secondary | ICD-10-CM

## 2022-11-03 DIAGNOSIS — R1031 Right lower quadrant pain: Secondary | ICD-10-CM | POA: Diagnosis not present

## 2022-11-03 DIAGNOSIS — B028 Zoster with other complications: Secondary | ICD-10-CM | POA: Diagnosis not present

## 2022-11-04 ENCOUNTER — Ambulatory Visit
Admission: RE | Admit: 2022-11-04 | Discharge: 2022-11-04 | Disposition: A | Discharge status: Home / Self Care | Payer: Medicare Other | Source: Ambulatory Visit | Attending: Family Medicine | Admitting: Family Medicine

## 2022-11-04 DIAGNOSIS — R109 Unspecified abdominal pain: Secondary | ICD-10-CM

## 2022-11-04 DIAGNOSIS — K5730 Diverticulosis of large intestine without perforation or abscess without bleeding: Secondary | ICD-10-CM | POA: Diagnosis not present

## 2022-11-04 DIAGNOSIS — Z85528 Personal history of other malignant neoplasm of kidney: Secondary | ICD-10-CM

## 2022-11-04 DIAGNOSIS — Z905 Acquired absence of kidney: Secondary | ICD-10-CM | POA: Diagnosis not present

## 2022-11-04 DIAGNOSIS — D1800 Hemangioma unspecified site: Secondary | ICD-10-CM | POA: Diagnosis not present

## 2022-11-05 ENCOUNTER — Ambulatory Visit
Admission: RE | Admit: 2022-11-05 | Discharge: 2022-11-05 | Disposition: A | Discharge status: Home / Self Care | Payer: Medicare Other | Source: Ambulatory Visit | Attending: Family Medicine | Admitting: Family Medicine

## 2022-11-05 DIAGNOSIS — R109 Unspecified abdominal pain: Secondary | ICD-10-CM

## 2022-11-05 DIAGNOSIS — R188 Other ascites: Secondary | ICD-10-CM | POA: Diagnosis not present

## 2022-11-05 DIAGNOSIS — K6389 Other specified diseases of intestine: Secondary | ICD-10-CM | POA: Diagnosis not present

## 2022-11-05 DIAGNOSIS — K5730 Diverticulosis of large intestine without perforation or abscess without bleeding: Secondary | ICD-10-CM | POA: Diagnosis not present

## 2022-11-05 DIAGNOSIS — Z85528 Personal history of other malignant neoplasm of kidney: Secondary | ICD-10-CM

## 2022-12-03 DIAGNOSIS — Z85528 Personal history of other malignant neoplasm of kidney: Secondary | ICD-10-CM | POA: Diagnosis not present

## 2022-12-03 DIAGNOSIS — M8580 Other specified disorders of bone density and structure, unspecified site: Secondary | ICD-10-CM | POA: Diagnosis not present

## 2022-12-03 DIAGNOSIS — Z0000 Encounter for general adult medical examination without abnormal findings: Secondary | ICD-10-CM | POA: Diagnosis not present

## 2022-12-03 DIAGNOSIS — M8588 Other specified disorders of bone density and structure, other site: Secondary | ICD-10-CM | POA: Diagnosis not present

## 2022-12-03 DIAGNOSIS — R030 Elevated blood-pressure reading, without diagnosis of hypertension: Secondary | ICD-10-CM | POA: Diagnosis not present

## 2022-12-03 DIAGNOSIS — E041 Nontoxic single thyroid nodule: Secondary | ICD-10-CM | POA: Diagnosis not present

## 2022-12-03 DIAGNOSIS — I700 Atherosclerosis of aorta: Secondary | ICD-10-CM | POA: Diagnosis not present

## 2022-12-03 DIAGNOSIS — Z905 Acquired absence of kidney: Secondary | ICD-10-CM | POA: Diagnosis not present

## 2023-01-12 DIAGNOSIS — Z1231 Encounter for screening mammogram for malignant neoplasm of breast: Secondary | ICD-10-CM | POA: Diagnosis not present

## 2023-02-22 DIAGNOSIS — L821 Other seborrheic keratosis: Secondary | ICD-10-CM | POA: Diagnosis not present

## 2023-02-22 DIAGNOSIS — Z85828 Personal history of other malignant neoplasm of skin: Secondary | ICD-10-CM | POA: Diagnosis not present

## 2023-02-22 DIAGNOSIS — B0229 Other postherpetic nervous system involvement: Secondary | ICD-10-CM | POA: Diagnosis not present

## 2023-02-22 DIAGNOSIS — L570 Actinic keratosis: Secondary | ICD-10-CM | POA: Diagnosis not present

## 2023-02-22 DIAGNOSIS — L718 Other rosacea: Secondary | ICD-10-CM | POA: Diagnosis not present

## 2023-02-22 DIAGNOSIS — L578 Other skin changes due to chronic exposure to nonionizing radiation: Secondary | ICD-10-CM | POA: Diagnosis not present

## 2023-02-22 DIAGNOSIS — D225 Melanocytic nevi of trunk: Secondary | ICD-10-CM | POA: Diagnosis not present

## 2023-02-22 DIAGNOSIS — L814 Other melanin hyperpigmentation: Secondary | ICD-10-CM | POA: Diagnosis not present

## 2023-02-22 DIAGNOSIS — D1801 Hemangioma of skin and subcutaneous tissue: Secondary | ICD-10-CM | POA: Diagnosis not present

## 2023-02-22 DIAGNOSIS — L820 Inflamed seborrheic keratosis: Secondary | ICD-10-CM | POA: Diagnosis not present

## 2023-02-22 DIAGNOSIS — D2262 Melanocytic nevi of left upper limb, including shoulder: Secondary | ICD-10-CM | POA: Diagnosis not present

## 2023-07-18 DIAGNOSIS — Z23 Encounter for immunization: Secondary | ICD-10-CM | POA: Diagnosis not present

## 2023-10-18 DIAGNOSIS — L2089 Other atopic dermatitis: Secondary | ICD-10-CM | POA: Diagnosis not present

## 2023-10-18 DIAGNOSIS — L821 Other seborrheic keratosis: Secondary | ICD-10-CM | POA: Diagnosis not present

## 2023-10-18 DIAGNOSIS — L718 Other rosacea: Secondary | ICD-10-CM | POA: Diagnosis not present

## 2023-10-18 DIAGNOSIS — L570 Actinic keratosis: Secondary | ICD-10-CM | POA: Diagnosis not present

## 2023-10-18 DIAGNOSIS — Z85828 Personal history of other malignant neoplasm of skin: Secondary | ICD-10-CM | POA: Diagnosis not present

## 2023-10-18 DIAGNOSIS — I788 Other diseases of capillaries: Secondary | ICD-10-CM | POA: Diagnosis not present

## 2023-12-02 DIAGNOSIS — R7301 Impaired fasting glucose: Secondary | ICD-10-CM | POA: Diagnosis not present

## 2023-12-02 DIAGNOSIS — Z13220 Encounter for screening for lipoid disorders: Secondary | ICD-10-CM | POA: Diagnosis not present

## 2023-12-02 DIAGNOSIS — Z0000 Encounter for general adult medical examination without abnormal findings: Secondary | ICD-10-CM | POA: Diagnosis not present

## 2023-12-02 DIAGNOSIS — M8588 Other specified disorders of bone density and structure, other site: Secondary | ICD-10-CM | POA: Diagnosis not present

## 2023-12-02 DIAGNOSIS — E041 Nontoxic single thyroid nodule: Secondary | ICD-10-CM | POA: Diagnosis not present

## 2023-12-02 DIAGNOSIS — Z136 Encounter for screening for cardiovascular disorders: Secondary | ICD-10-CM | POA: Diagnosis not present

## 2023-12-02 DIAGNOSIS — Z905 Acquired absence of kidney: Secondary | ICD-10-CM | POA: Diagnosis not present

## 2023-12-05 ENCOUNTER — Other Ambulatory Visit (HOSPITAL_COMMUNITY): Payer: Self-pay | Admitting: Family Medicine

## 2023-12-05 DIAGNOSIS — M8588 Other specified disorders of bone density and structure, other site: Secondary | ICD-10-CM | POA: Diagnosis not present

## 2023-12-05 DIAGNOSIS — Z13220 Encounter for screening for lipoid disorders: Secondary | ICD-10-CM

## 2023-12-05 DIAGNOSIS — R030 Elevated blood-pressure reading, without diagnosis of hypertension: Secondary | ICD-10-CM | POA: Diagnosis not present

## 2023-12-05 DIAGNOSIS — Z0000 Encounter for general adult medical examination without abnormal findings: Secondary | ICD-10-CM | POA: Diagnosis not present

## 2023-12-05 DIAGNOSIS — E041 Nontoxic single thyroid nodule: Secondary | ICD-10-CM | POA: Diagnosis not present

## 2023-12-05 DIAGNOSIS — Z85528 Personal history of other malignant neoplasm of kidney: Secondary | ICD-10-CM | POA: Diagnosis not present

## 2023-12-05 DIAGNOSIS — E785 Hyperlipidemia, unspecified: Secondary | ICD-10-CM | POA: Diagnosis not present

## 2023-12-05 DIAGNOSIS — Z905 Acquired absence of kidney: Secondary | ICD-10-CM | POA: Diagnosis not present

## 2023-12-05 DIAGNOSIS — I700 Atherosclerosis of aorta: Secondary | ICD-10-CM | POA: Diagnosis not present

## 2023-12-19 ENCOUNTER — Ambulatory Visit (HOSPITAL_COMMUNITY)
Admission: RE | Admit: 2023-12-19 | Discharge: 2023-12-19 | Disposition: A | Discharge status: Home / Self Care | Payer: Self-pay | Source: Ambulatory Visit | Attending: Family Medicine | Admitting: Family Medicine

## 2023-12-19 DIAGNOSIS — Z13220 Encounter for screening for lipoid disorders: Secondary | ICD-10-CM | POA: Insufficient documentation

## 2024-01-18 DIAGNOSIS — Z8262 Family history of osteoporosis: Secondary | ICD-10-CM | POA: Diagnosis not present

## 2024-01-18 DIAGNOSIS — Z1231 Encounter for screening mammogram for malignant neoplasm of breast: Secondary | ICD-10-CM | POA: Diagnosis not present

## 2024-01-18 DIAGNOSIS — M8588 Other specified disorders of bone density and structure, other site: Secondary | ICD-10-CM | POA: Diagnosis not present

## 2024-02-23 DIAGNOSIS — E785 Hyperlipidemia, unspecified: Secondary | ICD-10-CM | POA: Diagnosis not present

## 2024-04-04 DIAGNOSIS — D225 Melanocytic nevi of trunk: Secondary | ICD-10-CM | POA: Diagnosis not present

## 2024-04-04 DIAGNOSIS — D1801 Hemangioma of skin and subcutaneous tissue: Secondary | ICD-10-CM | POA: Diagnosis not present

## 2024-04-04 DIAGNOSIS — S1086XA Insect bite of other specified part of neck, initial encounter: Secondary | ICD-10-CM | POA: Diagnosis not present

## 2024-04-04 DIAGNOSIS — L821 Other seborrheic keratosis: Secondary | ICD-10-CM | POA: Diagnosis not present

## 2024-04-04 DIAGNOSIS — Z85828 Personal history of other malignant neoplasm of skin: Secondary | ICD-10-CM | POA: Diagnosis not present

## 2024-04-04 DIAGNOSIS — L814 Other melanin hyperpigmentation: Secondary | ICD-10-CM | POA: Diagnosis not present

## 2024-04-04 DIAGNOSIS — L820 Inflamed seborrheic keratosis: Secondary | ICD-10-CM | POA: Diagnosis not present

## 2024-04-04 DIAGNOSIS — L570 Actinic keratosis: Secondary | ICD-10-CM | POA: Diagnosis not present

## 2024-04-04 DIAGNOSIS — L718 Other rosacea: Secondary | ICD-10-CM | POA: Diagnosis not present

## 2024-04-04 DIAGNOSIS — L500 Allergic urticaria: Secondary | ICD-10-CM | POA: Diagnosis not present

## 2024-04-22 NOTE — Progress Notes (Unsigned)
 Cardiology Office Note:    Date:  04/26/2024   ID:  Claire Humphrey, DOB 09-30-1950, MRN [id number]  PCP:  Leonel Cole, MD  Cardiologist:  None  Electrophysiologist:  None   Referring MD: Leonel Cole, MD   Chief Complaint  Patient presents with   Coronary Artery Disease    History of Present Illness:    Claire Humphrey is a 73 y.o. female with a hx of hyperlipidemia, renal cell cancer status post left nephrectomy, prediabetes who is referred by Dr. Leonel for evaluation of CAD.  Calcium score in 11/2023 was 212 (77th percentile).  Denies any chest pain, dyspnea, lightheadedness, syncope, lower extremity edema, or palpitations.  She walks for 45-50 minutes per day, denies any exertional symptoms.  She brought her home blood pressure log, and has been 100s to 120s.  No smoking history.  Family history includes paternal grandmother died of MI in 40s, had CHF and TIA, and older sister has CHF.   Past Medical History:  Diagnosis Date   Arthritis    hands    Fibrocystic breast changes    GERD (gastroesophageal reflux disease)    hx of    Goiter    multinodular thyroid     History of renal cell carcinoma 2016   S/P post left nephrectomy   Migraines    PTSD (post-traumatic stress disorder)    hx of    Rosacea    Thyroid  nodule, cold 2001   Transient hypothyroidism in 2001    Past Surgical History:  Procedure Laterality Date   bilateral carpal tunnel surgery      LAPAROSCOPIC NEPHRECTOMY Left 03/10/2015   Procedure: LAPAROSCOPIC LEFT NEPHRECTOMY;  Surgeon: Belvie LITTIE Clara, MD;  Location: [facility];  Service: Urology;  Laterality: Left;   laparoscopic surgery      due to ovarian cysts- 1990    left index finger titanium screw surgery      Left ovarian cyst     Resection   right middle finger cyst removal      TUBAL LIGATION      Current Medications: Current Meds  Medication Sig   Cholecalciferol 50 MCG (2000 UT) CAPS Take 1 capsule by mouth daily at 6 (six) AM.   Psyllium Husk  POWD Take 1 Scoop by mouth at bedtime.   rosuvastatin (CRESTOR) 10 MG tablet Take 10 mg by mouth daily.     Allergies:   Dilaudid   [hydromorphone  hcl], Adhesive [tape], Hydromorphone , Ibuprofen, Nsaids, Elemental sulfur, Latex, and Sulfa antibiotics   Social History   Socioeconomic History   Marital status: Married    Spouse name: Not on file   Number of children: 2   Years of education: Not on file   Highest education level: Some college, no degree  Occupational History   Occupation: Armed forces operational officer    Comment: retired  Tobacco Use   Smoking status: Never   Smokeless tobacco: Never  Substance and Sexual Activity   Alcohol use: Yes    Comment: occasional wine or beer    Drug use: No   Sexual activity: Not on file  Other Topics Concern   Not on file  Social History Narrative   She is a married mother of 2.  She is retired Research scientist (life sciences).  She retired in 2016 following her nephrectomy.  She initially went back to work, but then her husband needed to have knee surgery and she never started back after that.   Social Drivers of Corporate investment banker  Strain: Not on file  Food Insecurity: Not on file  Transportation Needs: Not on file  Physical Activity: Sufficiently Active (08/25/2017)   Exercise Vital Sign    Days of Exercise per Week: 4 days    Minutes of Exercise per Session: 40 min  Stress: Not on file  Social Connections: Not on file     Family History: The patient's family history includes Arrhythmia in her sister; Breast cancer in her mother; CAD in her mother; Colon polyps in her mother; Diabetes in her mother; Heart failure (age of onset: 69) in her mother; Hyperlipidemia in her mother; Leukemia in her father; Prostate cancer in her father; Renal cancer in her sister; Stroke (age of onset: 7) in her paternal grandfather; Thyroid  disease in her sister; Transient ischemic attack in her mother.  ROS:   Please see the history of present illness.      All other systems reviewed and are negative.  EKGs/Labs/Other Studies Reviewed:    The following studies were reviewed today:   EKG:  04/26/2024: Normal sinus rhythm, rate 86 no ST abnormalities  Recent Labs: No results found for requested labs within last 365 days.  Recent Lipid Panel No results found for: CHOL, TRIG, HDL, CHOLHDL, VLDL, LDLCALC, LDLDIRECT  Physical Exam:    VS:  BP (!) 162/84 (BP Location: Left Arm, Patient Position: Sitting, Cuff Size: Normal)   Pulse 86   Ht 5' 4.25 (1.632 m)   Wt 122 lb (55.3 kg)   SpO2 98%   BMI 20.78 kg/m     Wt Readings from Last 3 Encounters:  04/26/24 122 lb (55.3 kg)  04/23/22 130 lb (59 kg)  05/01/20 130 lb (59 kg)     GEN:  Well nourished, well developed in no acute distress HEENT: Normal NECK: No JVD; No carotid bruits LYMPHATICS: No lymphadenopathy CARDIAC: RRR, no murmurs, rubs, gallops RESPIRATORY:  Clear to auscultation without rales, wheezing or rhonchi  ABDOMEN: Soft, non-tender, non-distended MUSCULOSKELETAL:  No edema; No deformity  SKIN: Warm and dry NEUROLOGIC:  Alert and oriented x 3 PSYCHIATRIC:  Normal affect   ASSESSMENT:    1. Coronary artery disease involving native coronary artery of native heart without angina pectoris   2. Hyperlipidemia, unspecified hyperlipidemia type    PLAN:    CAD: Calcium score in 11/2023 was 212 (77th percentile).   Denies any anginal symptoms - Continue rosuvastatin 10 mg daily  Hyperlipidemia: On rosuvastatin 10 mg daily.  LDL 56 on 02/23/24  RTC in 1 year   Medication Adjustments/Labs and Tests Ordered: Current medicines are reviewed at length with the patient today.  Concerns regarding medicines are outlined above.  Orders Placed This Encounter  Procedures   EKG 12-Lead   No orders of the defined types were placed in this encounter.   Patient Instructions  Medication Instructions:  Continue current medication *If you need a refill on your  cardiac medications before your next appointment, please call your pharmacy*  Lab Work: none If you have labs (blood work) drawn today and your tests are completely normal, you will receive your results only by: MyChart Message (if you have MyChart) OR A paper copy in the mail If you have any lab test that is abnormal or we need to change your treatment, we will call you to review the results.  Testing/Procedures: none  Follow-Up: At [facility], you and your health needs are our priority.  As part of our continuing mission to provide you with exceptional heart  care, our providers are all part of one team.  This team includes your primary Cardiologist (physician) and Advanced Practice Providers or APPs (Physician Assistants and Nurse Practitioners) who all work together to provide you with the care you need, when you need it.  Your next appointment:   1 year(s)  Provider:   Dr.Raymie Trani We recommend signing up for the patient portal called MyChart.  Sign up information is provided on this After Visit Summary.  MyChart is used to connect with patients for Virtual Visits (Telemedicine).  Patients are able to view lab/test results, encounter notes, upcoming appointments, etc.  Non-urgent messages can be sent to your provider as well.   To learn more about what you can do with MyChart, go to [email or web address].   Other Instructions none       Signed, Lonni LITTIE Nanas, MD  04/26/2024 5:45 PM    [facility]

## 2024-04-26 ENCOUNTER — Ambulatory Visit: Attending: Cardiology | Admitting: Cardiology

## 2024-04-26 ENCOUNTER — Encounter: Payer: Self-pay | Admitting: Cardiology

## 2024-04-26 VITALS — BP 162/84 | HR 86 | Ht 64.25 in | Wt 122.0 lb

## 2024-04-26 DIAGNOSIS — E785 Hyperlipidemia, unspecified: Secondary | ICD-10-CM | POA: Insufficient documentation

## 2024-04-26 DIAGNOSIS — I2510 Atherosclerotic heart disease of native coronary artery without angina pectoris: Secondary | ICD-10-CM | POA: Diagnosis not present

## 2024-04-26 NOTE — Patient Instructions (Signed)
 Medication Instructions:  Continue current medication *If you need a refill on your cardiac medications before your next appointment, please call your pharmacy*  Lab Work: none If you have labs (blood work) drawn today and your tests are completely normal, you will receive your results only by: MyChart Message (if you have MyChart) OR A paper copy in the mail If you have any lab test that is abnormal or we need to change your treatment, we will call you to review the results.  Testing/Procedures: none  Follow-Up: At [facility], you and your health needs are our priority.  As part of our continuing mission to provide you with exceptional heart care, our providers are all part of one team.  This team includes your primary Cardiologist (physician) and Advanced Practice Providers or APPs (Physician Assistants and Nurse Practitioners) who all work together to provide you with the care you need, when you need it.  Your next appointment:   1 year(s)  Provider:   Dr. Alda Amas  We recommend signing up for the patient portal called "MyChart".  Sign up information is provided on this After Visit Summary.  MyChart is used to connect with patients for Virtual Visits (Telemedicine).  Patients are able to view lab/test results, encounter notes, upcoming appointments, etc.  Non-urgent messages can be sent to your provider as well.   To learn more about what you can do with MyChart, go to [email or web address].   Other Instructions none

## 2024-06-19 ENCOUNTER — Encounter (HOSPITAL_BASED_OUTPATIENT_CLINIC_OR_DEPARTMENT_OTHER): Payer: Self-pay | Admitting: *Deleted

## 2024-06-19 ENCOUNTER — Emergency Department (HOSPITAL_BASED_OUTPATIENT_CLINIC_OR_DEPARTMENT_OTHER)

## 2024-06-19 ENCOUNTER — Other Ambulatory Visit: Payer: Self-pay

## 2024-06-19 ENCOUNTER — Emergency Department (HOSPITAL_BASED_OUTPATIENT_CLINIC_OR_DEPARTMENT_OTHER)
Admission: EM | Admit: 2024-06-19 | Discharge: 2024-06-19 | Disposition: A | Discharge status: Home / Self Care | Attending: Emergency Medicine | Admitting: Emergency Medicine

## 2024-06-19 ENCOUNTER — Emergency Department (HOSPITAL_BASED_OUTPATIENT_CLINIC_OR_DEPARTMENT_OTHER): Admitting: Radiology

## 2024-06-19 DIAGNOSIS — Z91040 Latex allergy status: Secondary | ICD-10-CM | POA: Diagnosis not present

## 2024-06-19 DIAGNOSIS — M19032 Primary osteoarthritis, left wrist: Secondary | ICD-10-CM | POA: Diagnosis not present

## 2024-06-19 DIAGNOSIS — S0990XA Unspecified injury of head, initial encounter: Secondary | ICD-10-CM | POA: Diagnosis not present

## 2024-06-19 DIAGNOSIS — S199XXA Unspecified injury of neck, initial encounter: Secondary | ICD-10-CM | POA: Diagnosis not present

## 2024-06-19 DIAGNOSIS — W19XXXA Unspecified fall, initial encounter: Secondary | ICD-10-CM | POA: Insufficient documentation

## 2024-06-19 DIAGNOSIS — M79632 Pain in left forearm: Secondary | ICD-10-CM | POA: Diagnosis not present

## 2024-06-19 DIAGNOSIS — R55 Syncope and collapse: Secondary | ICD-10-CM | POA: Insufficient documentation

## 2024-06-19 DIAGNOSIS — Z043 Encounter for examination and observation following other accident: Secondary | ICD-10-CM | POA: Diagnosis not present

## 2024-06-19 DIAGNOSIS — M5030 Other cervical disc degeneration, unspecified cervical region: Secondary | ICD-10-CM | POA: Diagnosis not present

## 2024-06-19 DIAGNOSIS — M79642 Pain in left hand: Secondary | ICD-10-CM | POA: Diagnosis not present

## 2024-06-19 DIAGNOSIS — M51369 Other intervertebral disc degeneration, lumbar region without mention of lumbar back pain or lower extremity pain: Secondary | ICD-10-CM | POA: Diagnosis not present

## 2024-06-19 DIAGNOSIS — Y92009 Unspecified place in unspecified non-institutional (private) residence as the place of occurrence of the external cause: Secondary | ICD-10-CM | POA: Insufficient documentation

## 2024-06-19 DIAGNOSIS — M25552 Pain in left hip: Secondary | ICD-10-CM | POA: Diagnosis not present

## 2024-06-19 DIAGNOSIS — S0993XA Unspecified injury of face, initial encounter: Secondary | ICD-10-CM | POA: Diagnosis not present

## 2024-06-19 DIAGNOSIS — S0592XA Unspecified injury of left eye and orbit, initial encounter: Secondary | ICD-10-CM | POA: Diagnosis present

## 2024-06-19 DIAGNOSIS — Y93G1 Activity, food preparation and clean up: Secondary | ICD-10-CM | POA: Diagnosis not present

## 2024-06-19 DIAGNOSIS — S01112A Laceration without foreign body of left eyelid and periocular area, initial encounter: Secondary | ICD-10-CM | POA: Insufficient documentation

## 2024-06-19 DIAGNOSIS — S51812A Laceration without foreign body of left forearm, initial encounter: Secondary | ICD-10-CM | POA: Diagnosis not present

## 2024-06-19 DIAGNOSIS — M47812 Spondylosis without myelopathy or radiculopathy, cervical region: Secondary | ICD-10-CM | POA: Diagnosis not present

## 2024-06-19 LAB — COMPREHENSIVE METABOLIC PANEL WITH GFR
ALT: 26 U/L (ref 0–44)
AST: 28 U/L (ref 15–41)
Albumin: 4.8 g/dL (ref 3.5–5.0)
Alkaline Phosphatase: 83 U/L (ref 38–126)
Anion gap: 14 (ref 5–15)
BUN: 19 mg/dL (ref 8–23)
CO2: 23 mmol/L (ref 22–32)
Calcium: 9.8 mg/dL (ref 8.9–10.3)
Chloride: 98 mmol/L (ref 98–111)
Creatinine, Ser: 0.99 mg/dL (ref 0.44–1.00)
GFR, Estimated: 60 mL/min — ABNORMAL LOW (ref >60)
Glucose, Bld: 108 mg/dL — ABNORMAL HIGH (ref 70–99)
Potassium: 3.8 mmol/L (ref 3.5–5.1)
Sodium: 136 mmol/L (ref 135–145)
Total Bilirubin: 0.6 mg/dL (ref 0.0–1.2)
Total Protein: 7.3 g/dL (ref 6.5–8.1)

## 2024-06-19 LAB — CBC
HCT: 41.8 % (ref 36.0–46.0)
Hemoglobin: 13.8 g/dL (ref 12.0–15.0)
MCH: 29.4 pg (ref 26.0–34.0)
MCHC: 33 g/dL (ref 30.0–36.0)
MCV: 88.9 fL (ref 80.0–100.0)
Platelets: 222 K/uL (ref 150–400)
RBC: 4.7 MIL/uL (ref 3.87–5.11)
RDW: 13.2 % (ref 11.5–15.5)
WBC: 8.7 K/uL (ref 4.0–10.5)
nRBC: 0 % (ref 0.0–0.2)

## 2024-06-19 LAB — TROPONIN T, HIGH SENSITIVITY: Troponin T High Sensitivity: <15 ng/L (ref 0–19)

## 2024-06-19 LAB — URINALYSIS, ROUTINE W REFLEX MICROSCOPIC
Bacteria, UA: NONE SEEN
Bilirubin Urine: NEGATIVE
Glucose, UA: NEGATIVE mg/dL
Hgb urine dipstick: NEGATIVE
Ketones, ur: NEGATIVE mg/dL
Nitrite: NEGATIVE
Protein, ur: NEGATIVE mg/dL
Specific Gravity, Urine: 1.005 (ref 1.005–1.030)
pH: 5.5 (ref 5.0–8.0)

## 2024-06-19 MED ORDER — SODIUM CHLORIDE 0.9% FLUSH
3.0000 mL | Freq: Once | INTRAVENOUS | Status: AC
  Administered 2024-06-19: 3 mL via INTRAVENOUS

## 2024-06-19 NOTE — ED Notes (Signed)
 Reviewed AVS/discharge instructions with patient. Time allotted for and all questions answered. Patient is agreeable for d/c and escorted to ED exit by staff.

## 2024-06-19 NOTE — ED Notes (Signed)
 Patient transported to X-ray

## 2024-06-19 NOTE — ED Provider Notes (Signed)
 [place] EMERGENCY DEPARTMENT AT [facility] Provider Note   CSN: [id number] Arrival date & time: 06/19/24  1626     Patient presents with: [place]   Claire Humphrey is a 73 y.o. female who states that she had a fall while at home, had been busy throughout the day running errands when she realized she had not eaten in the significant amount of time, was preparing food at home when she had a syncopal event, brief loss of consciousness waking up on the floor with pain on the left side of her face, left head, otherwise was ambulatory immediately and had no other symptoms.  Has tolerated oral intake since then, denies having any nausea or vomiting, denies any lasting dizziness or vertigo.  She believes she struck the left side of her body on the bar where she was preparing her food, struck her forearm and has some superficial laceration/skin tears to the same.  Bleeding controlled prior to arrival.  She does have a laceration to the eyebrow on the left, again hemorrhage controlled prior to arrival.  Ambulatory without assistance.  Has a previous medical history of arthritis, GERD, PTSD, migraines.    Fall Associated symptoms include headaches.       Prior to Admission medications   Medication Sig Start Date End Date Taking? Authorizing Provider  Azelaic Acid 15 % cream Apply topically daily. 01/21/20   [provider]  Cholecalciferol 50 MCG (2000 UT) CAPS Take 1 capsule by mouth daily at 6 (six) AM.    [provider]  metroNIDAZOLE (METROCREAM) 0.75 % cream Apply topically 2 (two) times daily. 03/19/20   [provider]  Psyllium Husk POWD Take 1 Scoop by mouth at bedtime.    [provider]  rosuvastatin (CRESTOR) 10 MG tablet Take 10 mg by mouth daily.    [provider]    Allergies: Dilaudid   [hydromorphone  hcl], Adhesive [tape], Hydromorphone , Ibuprofen, Nsaids, Elemental sulfur, Latex, and Sulfa antibiotics    Review of Systems   Skin:  Positive for wound.  Neurological:  Positive for headaches.  All other systems reviewed and are negative.   Updated Vital Signs BP (!) 146/82   Pulse 79   Temp (!) 97.4 F (36.3 C)   Resp 18   Ht 5' 4 (1.626 m)   Wt 54.9 kg   SpO2 99%   BMI 20.77 kg/m   Physical Exam Vitals and nursing note reviewed.  Constitutional:      General: She is not in acute distress.    Appearance: Normal appearance.  HENT:     Head: Normocephalic. Laceration present.      Comments: Minor laceration to the left eyebrow.    Right Ear: Hearing, tympanic membrane, ear canal and external ear normal.     Left Ear: Hearing, tympanic membrane, ear canal and external ear normal.     Nose: Nose normal.     Mouth/Throat:     Lips: Pink.     Mouth: Mucous membranes are moist.     Pharynx: Oropharynx is clear. Uvula midline.  Eyes:     Extraocular Movements: Extraocular movements intact.     Conjunctiva/sclera: Conjunctivae normal.     Pupils: Pupils are equal, round, and reactive to light.  Cardiovascular:     Rate and Rhythm: Normal rate and regular rhythm.     Pulses: Normal pulses.     Heart sounds: Normal heart sounds, S1 normal and S2 normal. No murmur heard.    No friction  rub. No gallop.  Pulmonary:     Effort: Pulmonary effort is normal.     Breath sounds: Normal breath sounds and air entry.  Abdominal:     General: Abdomen is flat. Bowel sounds are normal.     Palpations: Abdomen is soft.     Tenderness: There is no abdominal tenderness.  Musculoskeletal:        General: Normal range of motion.     Cervical back: Full passive range of motion without pain, normal range of motion and neck supple. No spinous process tenderness or muscular tenderness.     Right lower leg: No edema.     Left lower leg: No edema.  Skin:    General: Skin is warm and dry.     Capillary Refill: Capillary refill takes less than 2 seconds.  Neurological:     General: No focal deficit present.      Mental Status: She is alert and oriented to person, place, and time. Mental status is at baseline.     GCS: GCS eye subscore is 4. GCS verbal subscore is 5. GCS motor subscore is 6.     Cranial Nerves: Cranial nerves 2-12 are intact.     Sensory: Sensation is intact.     Motor: Motor function is intact.     Coordination: Coordination is intact.  Psychiatric:        Mood and Affect: Mood normal.     (all labs ordered are listed, but only abnormal results are displayed) Labs Reviewed  COMPREHENSIVE METABOLIC PANEL WITH GFR - Abnormal; Notable for the following components:      Result Value   Glucose, Bld 108 (*)    GFR, Estimated 60 (*)    All other components within normal limits  URINALYSIS, ROUTINE W REFLEX MICROSCOPIC - Abnormal; Notable for the following components:   Color, Urine COLORLESS (*)    Leukocytes,Ua SMALL (*)    All other components within normal limits  CBC  CBG MONITORING, ED  TROPONIN T, HIGH SENSITIVITY    EKG: EKG Interpretation Date/Time:  Tuesday June 19 2024 16:52:00 EDT Ventricular Rate:  83 PR Interval:  152 QRS Duration:  76 QT Interval:  354 QTC Calculation: 415 R Axis:   77  Text Interpretation: Sinus rhythm with Premature atrial complexes Otherwise normal ECG When compared with ECG of 26-Apr-2024 13:34, Premature atrial complexes are now Present Confirmed by Ruthe Cornet [phone]) on 06/19/2024 5:08:10 PM  Radiology: CT Maxillofacial Wo Contrast Result Date: 06/19/2024 CLINICAL DATA:  Facial trauma, blunt.  Fall. EXAM: CT MAXILLOFACIAL WITHOUT CONTRAST TECHNIQUE: Multidetector CT imaging of the maxillofacial structures was performed. Multiplanar CT image reconstructions were also generated. RADIATION DOSE REDUCTION: This exam was performed according to the departmental dose-optimization program which includes automated exposure control, adjustment of the mA and/or kV according to patient size and/or use of iterative reconstruction technique.  COMPARISON:  None Available. FINDINGS: Osseous: No fracture or mandibular dislocation. No destructive process. Orbits: No fracture or orbital emphysema.  Globes intact. Sinuses: No air-fluid levels. Soft tissues: Negative Limited intracranial: See head CT report IMPRESSION: No facial or orbital fracture. Electronically Signed   By: Franky Crease M.D.   On: 06/19/2024 18:17   CT Cervical Spine Wo Contrast Result Date: 06/19/2024 CLINICAL DATA:  Polytrauma, blunt.  Fall. EXAM: CT CERVICAL SPINE WITHOUT CONTRAST TECHNIQUE: Multidetector CT imaging of the cervical spine was performed without intravenous contrast. Multiplanar CT image reconstructions were also generated. RADIATION DOSE REDUCTION: This exam was performed  according to the departmental dose-optimization program which includes automated exposure control, adjustment of the mA and/or kV according to patient size and/or use of iterative reconstruction technique. COMPARISON:  None Available. FINDINGS: Alignment: No subluxation. Skull base and vertebrae: No acute fracture. No primary bone lesion or focal pathologic process. Soft tissues and spinal canal: No prevertebral fluid or swelling. No visible canal hematoma. Disc levels:  Multilevel degenerative disc and facet disease. Upper chest: No acute findings Other: None IMPRESSION: Multilevel degenerative changes. No acute bony abnormality. Electronically Signed   By: Franky Crease M.D.   On: 06/19/2024 18:09   CT Head Wo Contrast Result Date: 06/19/2024 CLINICAL DATA:  Polytrauma, blunt.  Fall. EXAM: CT HEAD WITHOUT CONTRAST TECHNIQUE: Contiguous axial images were obtained from the base of the skull through the vertex without intravenous contrast. RADIATION DOSE REDUCTION: This exam was performed according to the departmental dose-optimization program which includes automated exposure control, adjustment of the mA and/or kV according to patient size and/or use of iterative reconstruction technique. COMPARISON:   None Available. FINDINGS: Brain: No acute intracranial abnormality. Specifically, no hemorrhage, hydrocephalus, mass lesion, acute infarction, or significant intracranial injury. Vascular: No hyperdense vessel or unexpected calcification. Skull: No acute calvarial abnormality. Sinuses/Orbits: No acute findings Other: None IMPRESSION: Normal study. Electronically Signed   By: Franky Crease M.D.   On: 06/19/2024 18:08   DG Hip Unilat With Pelvis 2-3 Views Left Result Date: 06/19/2024 CLINICAL DATA:  pain EXAM: DG HIP (WITH OR WITHOUT PELVIS) 2-3V LEFT COMPARISON:  April 15, 2020 FINDINGS: No evidence of pelvic fracture or diastasis.No acute hip fracture or dislocation.Degenerative disc disease of the lower lumbar spine. Soft tissues are unremarkable. IMPRESSION: No acute fracture, pelvic bone diastasis, or dislocation. Electronically Signed   By: Rogelia Myers M.D.   On: 06/19/2024 17:24   DG Hand Complete Left Result Date: 06/19/2024 CLINICAL DATA:  pain EXAM: LEFT HAND - COMPLETE 3+ VIEW COMPARISON:  None Available. FINDINGS: Screw fixation of the distal interphalangeal joints of the second and third digits. 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 Degenerative changes of the carpal bones. Soft tissues are unremarkable. IMPRESSION: 1. No acute fracture or dislocation. 2. 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 Degenerative changes also noted in the wrist. Electronically Signed   By: Rogelia Myers M.D.   On: 06/19/2024 17:22   DG Forearm Left Result Date: 06/19/2024 CLINICAL DATA:  pain EXAM: LEFT FOREARM - 2 VIEW COMPARISON:  None Available. FINDINGS: No acute fracture or dislocation. There is no evidence of arthropathy or other focal bone abnormality. Soft tissues are unremarkable. IMPRESSION: No acute fracture or malalignment of the radius and ulna. Electronically Signed   By: Rogelia Myers M.D.   On: 06/19/2024  17:20   DG Chest 2 View Result Date: 06/19/2024 CLINICAL DATA:  fall EXAM: CHEST - 2 VIEW COMPARISON:  February 27, 2019 FINDINGS: No focal airspace consolidation, pleural effusion, or pneumothorax. No cardiomegaly.No acute fracture or destructive lesion. Multilevel thoracic osteophytosis. IMPRESSION: No acute cardiopulmonary abnormality. Electronically Signed   By: Rogelia Myers M.D.   On: 06/19/2024 17:19     .Laceration Repair  Date/Time: 06/19/2024 6:16 PM  Performed by: Myriam Dorn BROCKS, PA Authorized by: Myriam Dorn BROCKS, PA   Consent:    Consent obtained:  Verbal   Consent given by:  Patient   Risks, benefits, and alternatives were discussed: yes     Risks discussed:  Infection, poor  cosmetic result and poor wound healing   Alternatives discussed:  No treatment, delayed treatment and observation Universal protocol:    Procedure explained and questions answered to patient or proxy's satisfaction: yes     Patient identity confirmed:  Verbally with patient, arm band and hospital-assigned identification number Anesthesia:    Anesthesia method:  None Laceration details:    Location:  Face   Face location:  L eyebrow   Length (cm):  2   Depth (mm):  0 Exploration:    Hemostasis achieved with:  Direct pressure   Wound exploration: entire depth of wound visualized     Contaminated: no   Treatment:    Area cleansed with:  Shur-Clens   Amount of cleaning:  Standard   Visualized foreign bodies/material removed: no     Debridement:  None   Undermining:  None   Scar revision: no   Skin repair:    Repair method:  Tissue adhesive Approximation:    Approximation:  Close Repair type:    Repair type:  Simple Post-procedure details:    Dressing:  Open (no dressing)   Procedure completion:  Tolerated well, no immediate complications    Medications Ordered in the ED  sodium chloride  flush (NS) 0.9 % injection 3 mL (3 mLs Intravenous Given 06/19/24 1652)                                     Medical Decision Making Amount and/or Complexity of Data Reviewed Labs: ordered. Radiology: ordered.   Medical Decision Making:   Jaida Basurto is a 73 y.o. female who presented to the ED today with syncopal event and fall detailed above.     Complete initial physical exam performed, notably the patient  was alert and oriented in no apparent distress.  There is a notable laceration to the left eyebrow, superficial laceration to the left forearm, however no other significant exam findings..    Reviewed and confirmed nursing documentation for past medical history, family history, social history.    Initial Assessment:   With the patient's presentation of syncope and fall, most likely diagnosis is orthostatic hypotension..  Further rule out acute neurovascular insult, consider differential of vasovagal syncope, cardiac arrhythmia, aortic stenosis (though lack of appreciable murmur makes this less than likely).    Initial Plan:  Secondary to fall with impact to the head evaluate CT imaging of the head, C-spine, and maxillofacial structures. Obtain plain film imaging of the hips, left hand, left forearm, and chest to evaluate for acute fractures and pelvic and/or intrathoracic injury. Screening labs including CBC and Metabolic panel to evaluate for infectious or metabolic etiology of disease.  Urinalysis with reflex culture ordered to evaluate for UTI or relevant urologic/nephrologic pathology.  CXR to evaluate for structural/infectious intrathoracic pathology.  EKG to evaluate for cardiac pathology Objective evaluation as below reviewed   Initial Study Results:   Laboratory  All laboratory results reviewed without evidence of clinically relevant pathology.   Exceptions include: None  EKG EKG was reviewed independently. Rate, rhythm, axis, intervals all examined and without medically relevant abnormality. ST segments without concerns for elevations.    Radiology:  All  images reviewed independently. Agree with radiology report at this time.   CT Maxillofacial Wo Contrast Result Date: 06/19/2024 CLINICAL DATA:  Facial trauma, blunt.  Fall. EXAM: CT MAXILLOFACIAL WITHOUT CONTRAST TECHNIQUE: Multidetector CT imaging of the maxillofacial structures was performed. Multiplanar  CT image reconstructions were also generated. RADIATION DOSE REDUCTION: This exam was performed according to the departmental dose-optimization program which includes automated exposure control, adjustment of the mA and/or kV according to patient size and/or use of iterative reconstruction technique. COMPARISON:  None Available. FINDINGS: Osseous: No fracture or mandibular dislocation. No destructive process. Orbits: No fracture or orbital emphysema.  Globes intact. Sinuses: No air-fluid levels. Soft tissues: Negative Limited intracranial: See head CT report IMPRESSION: No facial or orbital fracture. Electronically Signed   By: Franky Crease M.D.   On: 06/19/2024 18:17   CT Cervical Spine Wo Contrast Result Date: 06/19/2024 CLINICAL DATA:  Polytrauma, blunt.  Fall. EXAM: CT CERVICAL SPINE WITHOUT CONTRAST TECHNIQUE: Multidetector CT imaging of the cervical spine was performed without intravenous contrast. Multiplanar CT image reconstructions were also generated. RADIATION DOSE REDUCTION: This exam was performed according to the departmental dose-optimization program which includes automated exposure control, adjustment of the mA and/or kV according to patient size and/or use of iterative reconstruction technique. COMPARISON:  None Available. FINDINGS: Alignment: No subluxation. Skull base and vertebrae: No acute fracture. No primary bone lesion or focal pathologic process. Soft tissues and spinal canal: No prevertebral fluid or swelling. No visible canal hematoma. Disc levels:  Multilevel degenerative disc and facet disease. Upper chest: No acute findings Other: None IMPRESSION: Multilevel degenerative  changes. No acute bony abnormality. Electronically Signed   By: Franky Crease M.D.   On: 06/19/2024 18:09   CT Head Wo Contrast Result Date: 06/19/2024 CLINICAL DATA:  Polytrauma, blunt.  Fall. EXAM: CT HEAD WITHOUT CONTRAST TECHNIQUE: Contiguous axial images were obtained from the base of the skull through the vertex without intravenous contrast. RADIATION DOSE REDUCTION: This exam was performed according to the departmental dose-optimization program which includes automated exposure control, adjustment of the mA and/or kV according to patient size and/or use of iterative reconstruction technique. COMPARISON:  None Available. FINDINGS: Brain: No acute intracranial abnormality. Specifically, no hemorrhage, hydrocephalus, mass lesion, acute infarction, or significant intracranial injury. Vascular: No hyperdense vessel or unexpected calcification. Skull: No acute calvarial abnormality. Sinuses/Orbits: No acute findings Other: None IMPRESSION: Normal study. Electronically Signed   By: Franky Crease M.D.   On: 06/19/2024 18:08   DG Hip Unilat With Pelvis 2-3 Views Left Result Date: 06/19/2024 CLINICAL DATA:  pain EXAM: DG HIP (WITH OR WITHOUT PELVIS) 2-3V LEFT COMPARISON:  April 15, 2020 FINDINGS: No evidence of pelvic fracture or diastasis.No acute hip fracture or dislocation.Degenerative disc disease of the lower lumbar spine. Soft tissues are unremarkable. IMPRESSION: No acute fracture, pelvic bone diastasis, or dislocation. Electronically Signed   By: Rogelia Myers M.D.   On: 06/19/2024 17:24   DG Hand Complete Left Result Date: 06/19/2024 CLINICAL DATA:  pain EXAM: LEFT HAND - COMPLETE 3+ VIEW COMPARISON:  None Available. FINDINGS: Screw fixation of the distal interphalangeal joints of the second and third digits. 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 Degenerative changes of the carpal bones. Soft tissues are unremarkable. IMPRESSION:  1. No acute fracture or dislocation. 2. 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 Degenerative changes also noted in the wrist. Electronically Signed   By: Rogelia Myers M.D.   On: 06/19/2024 17:22   DG Forearm Left Result Date: 06/19/2024 CLINICAL DATA:  pain EXAM: LEFT FOREARM - 2 VIEW COMPARISON:  None Available. FINDINGS: No acute fracture or dislocation. There is no evidence of arthropathy or other focal bone abnormality. Soft tissues are  unremarkable. IMPRESSION: No acute fracture or malalignment of the radius and ulna. Electronically Signed   By: Rogelia Myers M.D.   On: 06/19/2024 17:20   DG Chest 2 View Result Date: 06/19/2024 CLINICAL DATA:  fall EXAM: CHEST - 2 VIEW COMPARISON:  February 27, 2019 FINDINGS: No focal airspace consolidation, pleural effusion, or pneumothorax. No cardiomegaly.No acute fracture or destructive lesion. Multilevel thoracic osteophytosis. IMPRESSION: No acute cardiopulmonary abnormality. Electronically Signed   By: Rogelia Myers M.D.   On: 06/19/2024 17:19     Reassessment and Plan:   Imaging did not appreciate any acute fractures, she has some minor lacerations and laceration of the left brow was closed as noted in the procedure note.  Remainder of exam is unremarkable, EKG was unremarkable, there is no lab findings that would be indicative of an etiology of her syncope.  As such, believe she has a transient orthostatic hypotension likely secondary to overexertion.  Discussed this with the patient, advised that she follows up with primary care in the next several weeks to readdress her chronic health conditions as well as to reevaluate to assess for change, encouraged increased oral liquid intake as well as rest for the next several days to prevent recurrence.  She understands and agrees, has no further concerns at this time.  As her vital signs remained stable, CT imaging did not show any remarkable findings, she is stable for discharge and  outpatient follow-up.       Final diagnoses:  Eyebrow laceration, left, initial encounter  Syncope, unspecified syncope type    ED Discharge Orders     None          Myriam Dorn BROCKS, PA 06/19/24 1832    Ruthe Cornet, DO 06/19/24 2104

## 2024-06-19 NOTE — ED Notes (Signed)
Dermabond to bedside.

## 2024-06-19 NOTE — ED Triage Notes (Signed)
 BIB family from home s/p fall around 1445, fell onto hardwood floor. Fell onto L side. Felt light-headed/ dizzy. Thought it was b/c she waited too long to eat. Thinks she passed out, then fell. Hit Left side: head, face, FA, wrist, hand, hip, and buttocks. Denies taking blood thinners. Alert, NAD, calm, interactive, steady gait. Rates pain 2/10. Denies other sx. Abrasions noted to L eyelid, L hand wrist.

## 2024-06-26 DIAGNOSIS — R55 Syncope and collapse: Secondary | ICD-10-CM | POA: Diagnosis not present

## 2024-06-26 DIAGNOSIS — E785 Hyperlipidemia, unspecified: Secondary | ICD-10-CM | POA: Diagnosis not present

## 2024-07-06 DIAGNOSIS — Z23 Encounter for immunization: Secondary | ICD-10-CM | POA: Diagnosis not present

## 2024-08-09 DIAGNOSIS — E785 Hyperlipidemia, unspecified: Secondary | ICD-10-CM | POA: Diagnosis not present

## 2024-08-09 DIAGNOSIS — R030 Elevated blood-pressure reading, without diagnosis of hypertension: Secondary | ICD-10-CM | POA: Diagnosis not present
